# Patient Record
Sex: Male | Born: 1940 | Race: White | Hispanic: No | Marital: Single
Health system: Midwestern US, Academic
[De-identification: ages and names within clinical notes are randomized; demographics above are authoritative.]

---

## 2016-11-24 ENCOUNTER — Encounter: Admit: 2016-11-24 | Discharge: 2016-11-24 | Payer: MEDICARE

## 2016-11-24 DIAGNOSIS — I482 Chronic atrial fibrillation, unspecified: Principal | ICD-10-CM

## 2016-12-31 ENCOUNTER — Encounter: Admit: 2016-12-31 | Discharge: 2016-12-31 | Payer: MEDICARE

## 2016-12-31 DIAGNOSIS — I482 Chronic atrial fibrillation, unspecified: Principal | ICD-10-CM

## 2017-02-05 ENCOUNTER — Encounter: Admit: 2017-02-05 | Discharge: 2017-02-05 | Payer: MEDICARE

## 2017-02-05 MED ORDER — METOPROLOL TARTRATE 50 MG PO TAB
50 mg | ORAL_TABLET | Freq: Two times a day (BID) | ORAL | 2 refills | 90.00000 days | Status: AC
Start: 2017-02-05 — End: 2017-11-02

## 2017-02-23 ENCOUNTER — Encounter: Admit: 2017-02-23 | Discharge: 2017-02-23 | Payer: MEDICARE

## 2017-02-23 DIAGNOSIS — I482 Chronic atrial fibrillation, unspecified: Principal | ICD-10-CM

## 2017-04-09 ENCOUNTER — Encounter: Admit: 2017-04-09 | Discharge: 2017-04-09 | Payer: MEDICARE

## 2017-04-09 DIAGNOSIS — I482 Chronic atrial fibrillation, unspecified: Principal | ICD-10-CM

## 2017-05-18 ENCOUNTER — Encounter: Admit: 2017-05-18 | Discharge: 2017-05-18 | Payer: MEDICARE

## 2017-05-18 DIAGNOSIS — I482 Chronic atrial fibrillation, unspecified: Principal | ICD-10-CM

## 2017-07-08 ENCOUNTER — Encounter: Admit: 2017-07-08 | Discharge: 2017-07-08 | Payer: MEDICARE

## 2017-07-08 DIAGNOSIS — I482 Chronic atrial fibrillation, unspecified: Principal | ICD-10-CM

## 2017-07-08 LAB — PROTIME INR (PT): Lab: 2.4

## 2017-08-16 LAB — BASIC METABOLIC PANEL
Lab: 104
Lab: 140
Lab: 18 — ABNORMAL HIGH (ref 27.0–31.0)
Lab: 26

## 2017-08-16 LAB — CBC: Lab: 7.3

## 2017-08-24 ENCOUNTER — Encounter: Admit: 2017-08-24 | Discharge: 2017-08-24 | Payer: MEDICARE

## 2017-08-24 DIAGNOSIS — I7771 Dissection of carotid artery: Principal | ICD-10-CM

## 2017-08-26 ENCOUNTER — Encounter: Admit: 2017-08-26 | Discharge: 2017-08-26 | Payer: MEDICARE

## 2017-08-26 ENCOUNTER — Ambulatory Visit: Admit: 2017-08-26 | Discharge: 2017-08-26 | Payer: MEDICARE

## 2017-08-26 DIAGNOSIS — I7771 Dissection of carotid artery: Principal | ICD-10-CM

## 2017-08-26 MED ORDER — SODIUM CHLORIDE 0.9 % IJ SOLN
50 mL | Freq: Once | INTRAVENOUS | 0 refills | Status: CP
Start: 2017-08-26 — End: ?
  Administered 2017-08-26: 14:00:00 50 mL via INTRAVENOUS

## 2017-08-26 MED ORDER — IOHEXOL 350 MG IODINE/ML IV SOLN
60 mL | Freq: Once | INTRAVENOUS | 0 refills | Status: CP
Start: 2017-08-26 — End: ?
  Administered 2017-08-26: 14:00:00 60 mL via INTRAVENOUS

## 2017-08-27 ENCOUNTER — Encounter: Admit: 2017-08-27 | Discharge: 2017-08-27 | Payer: MEDICARE

## 2017-08-27 ENCOUNTER — Ambulatory Visit: Admit: 2017-08-27 | Discharge: 2017-08-27 | Payer: MEDICARE

## 2017-08-27 DIAGNOSIS — I7771 Dissection of carotid artery: Principal | ICD-10-CM

## 2017-08-27 DIAGNOSIS — I272 Pulmonary hypertension, unspecified: ICD-10-CM

## 2017-08-27 DIAGNOSIS — I10 Essential (primary) hypertension: ICD-10-CM

## 2017-08-27 DIAGNOSIS — I482 Chronic atrial fibrillation, unspecified: ICD-10-CM

## 2017-08-27 DIAGNOSIS — E079 Disorder of thyroid, unspecified: Principal | ICD-10-CM

## 2017-08-27 DIAGNOSIS — I4891 Unspecified atrial fibrillation: Principal | ICD-10-CM

## 2017-08-27 DIAGNOSIS — Z13220 Encounter for screening for lipoid disorders: Secondary | ICD-10-CM

## 2017-08-27 DIAGNOSIS — I350 Nonrheumatic aortic (valve) stenosis: ICD-10-CM

## 2017-08-27 LAB — LIPID PROFILE
Lab: 111 mg/dL (ref <150)
Lab: 149 mg/dL — ABNORMAL HIGH (ref <100)
Lab: 164 mg/dL
Lab: 223 mg/dL — ABNORMAL HIGH (ref <200)

## 2017-08-27 LAB — C REACTIVE PROTEIN (CRP): Lab: 0.1 mg/dL (ref <1.0)

## 2017-08-27 LAB — PROTIME INR (PT): Lab: 2.2 mg/dL — ABNORMAL HIGH (ref >40)

## 2017-08-27 LAB — SED RATE: Lab: 22 mm/h — ABNORMAL HIGH (ref 0–20)

## 2017-08-27 MED ORDER — ATORVASTATIN 40 MG PO TAB
40 mg | ORAL_TABLET | Freq: Every day | ORAL | 3 refills | Status: AC
Start: 2017-08-27 — End: 2018-09-12

## 2017-08-30 ENCOUNTER — Ambulatory Visit: Admit: 2017-08-30 | Discharge: 2017-08-31 | Payer: MEDICARE

## 2017-08-30 DIAGNOSIS — I350 Nonrheumatic aortic (valve) stenosis: Principal | ICD-10-CM

## 2017-08-30 DIAGNOSIS — I272 Pulmonary hypertension, unspecified: ICD-10-CM

## 2017-08-30 MED ORDER — PERFLUTREN LIPID MICROSPHERES 1.1 MG/ML IV SUSP
1-20 mL | Freq: Once | INTRAVENOUS | 0 refills | Status: CP | PRN
Start: 2017-08-30 — End: ?

## 2017-09-14 ENCOUNTER — Ambulatory Visit: Admit: 2017-09-14 | Discharge: 2017-09-15 | Payer: MEDICARE

## 2017-09-14 ENCOUNTER — Ambulatory Visit: Admit: 2017-09-14 | Discharge: 2017-09-14 | Payer: MEDICARE

## 2017-09-14 DIAGNOSIS — I7771 Dissection of carotid artery: Principal | ICD-10-CM

## 2017-09-14 DIAGNOSIS — E079 Disorder of thyroid, unspecified: ICD-10-CM

## 2017-09-14 MED ORDER — IOHEXOL 350 MG IODINE/ML IV SOLN
100 mL | Freq: Once | INTRAVENOUS | 0 refills | Status: CP
Start: 2017-09-14 — End: ?
  Administered 2017-09-14: 20:00:00 100 mL via INTRAVENOUS

## 2017-09-14 MED ORDER — SODIUM CHLORIDE 0.9 % IJ SOLN
50 mL | Freq: Once | INTRAVENOUS | 0 refills | Status: CP
Start: 2017-09-14 — End: ?
  Administered 2017-09-14: 20:00:00 50 mL via INTRAVENOUS

## 2017-09-17 ENCOUNTER — Encounter: Admit: 2017-09-17 | Discharge: 2017-09-17 | Payer: MEDICARE

## 2017-09-20 ENCOUNTER — Encounter: Admit: 2017-09-20 | Discharge: 2017-09-20 | Payer: MEDICARE

## 2017-09-20 DIAGNOSIS — I4891 Unspecified atrial fibrillation: Principal | ICD-10-CM

## 2017-09-20 DIAGNOSIS — I10 Essential (primary) hypertension: ICD-10-CM

## 2017-09-20 DIAGNOSIS — I350 Nonrheumatic aortic (valve) stenosis: ICD-10-CM

## 2017-09-28 ENCOUNTER — Encounter: Admit: 2017-09-28 | Discharge: 2017-09-28 | Payer: MEDICARE

## 2017-09-28 DIAGNOSIS — E079 Disorder of thyroid, unspecified: Principal | ICD-10-CM

## 2017-09-28 DIAGNOSIS — I4891 Unspecified atrial fibrillation: ICD-10-CM

## 2017-09-28 DIAGNOSIS — I482 Chronic atrial fibrillation, unspecified: Principal | ICD-10-CM

## 2017-09-28 LAB — PROTIME INR (PT): Lab: 2.1

## 2017-09-29 ENCOUNTER — Encounter: Admit: 2017-09-29 | Discharge: 2017-09-29 | Payer: MEDICARE

## 2017-09-29 DIAGNOSIS — Z7901 Long term (current) use of anticoagulants: ICD-10-CM

## 2017-09-29 DIAGNOSIS — I4891 Unspecified atrial fibrillation: Principal | ICD-10-CM

## 2017-09-30 ENCOUNTER — Ambulatory Visit: Admit: 2017-09-30 | Discharge: 2017-09-30 | Payer: MEDICARE

## 2017-09-30 ENCOUNTER — Encounter: Admit: 2017-09-30 | Discharge: 2017-09-30 | Payer: MEDICARE

## 2017-09-30 DIAGNOSIS — Z880 Allergy status to penicillin: ICD-10-CM

## 2017-09-30 DIAGNOSIS — E042 Nontoxic multinodular goiter: Principal | ICD-10-CM

## 2017-09-30 DIAGNOSIS — E079 Disorder of thyroid, unspecified: ICD-10-CM

## 2017-09-30 DIAGNOSIS — E785 Hyperlipidemia, unspecified: ICD-10-CM

## 2017-09-30 DIAGNOSIS — I4891 Unspecified atrial fibrillation: Principal | ICD-10-CM

## 2017-10-11 ENCOUNTER — Encounter: Admit: 2017-10-11 | Discharge: 2017-10-11 | Payer: MEDICARE

## 2017-10-11 ENCOUNTER — Ambulatory Visit: Admit: 2017-10-11 | Discharge: 2017-10-12 | Payer: MEDICARE

## 2017-10-11 DIAGNOSIS — E785 Hyperlipidemia, unspecified: ICD-10-CM

## 2017-10-11 DIAGNOSIS — I4891 Unspecified atrial fibrillation: Principal | ICD-10-CM

## 2017-10-11 DIAGNOSIS — I7771 Dissection of carotid artery: Principal | ICD-10-CM

## 2017-10-14 ENCOUNTER — Ambulatory Visit: Admit: 2017-10-14 | Discharge: 2017-10-15 | Payer: MEDICARE

## 2017-10-14 ENCOUNTER — Encounter: Admit: 2017-10-14 | Discharge: 2017-10-14 | Payer: MEDICARE

## 2017-10-14 DIAGNOSIS — Z7901 Long term (current) use of anticoagulants: ICD-10-CM

## 2017-10-14 DIAGNOSIS — I10 Essential (primary) hypertension: ICD-10-CM

## 2017-10-14 DIAGNOSIS — I7771 Dissection of carotid artery: ICD-10-CM

## 2017-10-14 DIAGNOSIS — E785 Hyperlipidemia, unspecified: ICD-10-CM

## 2017-10-14 DIAGNOSIS — I482 Chronic atrial fibrillation, unspecified: Principal | ICD-10-CM

## 2017-10-14 DIAGNOSIS — I4891 Unspecified atrial fibrillation: Principal | ICD-10-CM

## 2017-10-21 ENCOUNTER — Encounter: Admit: 2017-10-21 | Discharge: 2017-10-21 | Payer: MEDICARE

## 2017-10-21 DIAGNOSIS — I4891 Unspecified atrial fibrillation: Principal | ICD-10-CM

## 2017-10-21 MED ORDER — WARFARIN 5 MG PO TAB
ORAL_TABLET | Freq: Every day | ORAL | 3 refills | 90.00000 days | Status: AC
Start: 2017-10-21 — End: 2019-01-16

## 2017-10-26 ENCOUNTER — Encounter: Admit: 2017-10-26 | Discharge: 2017-10-26 | Payer: MEDICARE

## 2017-10-26 DIAGNOSIS — I482 Chronic atrial fibrillation, unspecified: Principal | ICD-10-CM

## 2017-10-26 DIAGNOSIS — Z7901 Long term (current) use of anticoagulants: ICD-10-CM

## 2017-10-26 DIAGNOSIS — I4891 Unspecified atrial fibrillation: ICD-10-CM

## 2017-10-26 LAB — PROTIME INR (PT): Lab: 2.2

## 2017-11-02 ENCOUNTER — Encounter: Admit: 2017-11-02 | Discharge: 2017-11-02 | Payer: MEDICARE

## 2017-11-02 MED ORDER — METOPROLOL TARTRATE 50 MG PO TAB
ORAL_TABLET | Freq: Two times a day (BID) | ORAL | 2 refills | 90.00000 days | Status: AC
Start: 2017-11-02 — End: 2018-07-29

## 2017-11-23 ENCOUNTER — Encounter: Admit: 2017-11-23 | Discharge: 2017-11-23 | Payer: MEDICARE

## 2017-11-23 DIAGNOSIS — Z7901 Long term (current) use of anticoagulants: ICD-10-CM

## 2017-11-23 DIAGNOSIS — I482 Chronic atrial fibrillation, unspecified: Principal | ICD-10-CM

## 2017-11-23 DIAGNOSIS — I4891 Unspecified atrial fibrillation: ICD-10-CM

## 2017-11-23 LAB — PROTIME INR (PT): Lab: 2.3

## 2017-12-21 ENCOUNTER — Encounter: Admit: 2017-12-21 | Discharge: 2017-12-21 | Payer: MEDICARE

## 2017-12-21 DIAGNOSIS — I482 Chronic atrial fibrillation, unspecified: Principal | ICD-10-CM

## 2017-12-21 DIAGNOSIS — I4891 Unspecified atrial fibrillation: Principal | ICD-10-CM

## 2017-12-21 DIAGNOSIS — Z7901 Long term (current) use of anticoagulants: ICD-10-CM

## 2017-12-21 LAB — PROTIME INR (PT): Lab: 2.5 U/L (ref 7–56)

## 2018-01-03 ENCOUNTER — Encounter: Admit: 2018-01-03 | Discharge: 2018-01-03 | Payer: MEDICARE

## 2018-01-03 ENCOUNTER — Ambulatory Visit: Admit: 2018-01-03 | Discharge: 2018-01-04 | Payer: MEDICARE

## 2018-01-03 DIAGNOSIS — I10 Essential (primary) hypertension: ICD-10-CM

## 2018-01-03 DIAGNOSIS — I4891 Unspecified atrial fibrillation: Principal | ICD-10-CM

## 2018-01-03 DIAGNOSIS — I7771 Dissection of carotid artery: ICD-10-CM

## 2018-01-03 DIAGNOSIS — I350 Nonrheumatic aortic (valve) stenosis: ICD-10-CM

## 2018-01-03 DIAGNOSIS — E785 Hyperlipidemia, unspecified: ICD-10-CM

## 2018-01-25 ENCOUNTER — Encounter: Admit: 2018-01-25 | Discharge: 2018-01-25 | Payer: MEDICARE

## 2018-01-25 DIAGNOSIS — I4891 Unspecified atrial fibrillation: Principal | ICD-10-CM

## 2018-01-25 DIAGNOSIS — Z7901 Long term (current) use of anticoagulants: ICD-10-CM

## 2018-01-25 DIAGNOSIS — I482 Chronic atrial fibrillation, unspecified: Principal | ICD-10-CM

## 2018-01-25 LAB — PROTIME INR (PT): Lab: 2.3

## 2018-02-22 ENCOUNTER — Encounter: Admit: 2018-02-22 | Discharge: 2018-02-22 | Payer: MEDICARE

## 2018-02-22 DIAGNOSIS — I4891 Unspecified atrial fibrillation: Principal | ICD-10-CM

## 2018-02-22 DIAGNOSIS — Z7901 Long term (current) use of anticoagulants: ICD-10-CM

## 2018-02-22 LAB — PROTIME INR (PT): Lab: 2.5

## 2018-02-28 ENCOUNTER — Ambulatory Visit: Admit: 2018-02-28 | Discharge: 2018-02-28 | Payer: MEDICARE

## 2018-02-28 ENCOUNTER — Encounter: Admit: 2018-02-28 | Discharge: 2018-02-28 | Payer: MEDICARE

## 2018-02-28 DIAGNOSIS — I7771 Dissection of carotid artery: Principal | ICD-10-CM

## 2018-02-28 DIAGNOSIS — I4891 Unspecified atrial fibrillation: Principal | ICD-10-CM

## 2018-02-28 DIAGNOSIS — E785 Hyperlipidemia, unspecified: ICD-10-CM

## 2018-02-28 LAB — POC CREATININE, RAD: Lab: 0.9 mg/dL (ref 0.4–1.24)

## 2018-02-28 MED ORDER — IOHEXOL 350 MG IODINE/ML IV SOLN
70 mL | Freq: Once | INTRAVENOUS | 0 refills | Status: CP
Start: 2018-02-28 — End: ?
  Administered 2018-02-28: 15:00:00 70 mL via INTRAVENOUS

## 2018-02-28 MED ORDER — SODIUM CHLORIDE 0.9 % IJ SOLN
50 mL | Freq: Once | INTRAVENOUS | 0 refills | Status: CP
Start: 2018-02-28 — End: ?
  Administered 2018-02-28: 15:00:00 50 mL via INTRAVENOUS

## 2018-03-22 ENCOUNTER — Encounter: Admit: 2018-03-22 | Discharge: 2018-03-22 | Payer: MEDICARE

## 2018-03-22 DIAGNOSIS — I4891 Unspecified atrial fibrillation: Principal | ICD-10-CM

## 2018-03-22 DIAGNOSIS — Z7901 Long term (current) use of anticoagulants: ICD-10-CM

## 2018-03-22 LAB — PROTIME INR (PT): Lab: 2.6 pg (ref 26–34)

## 2018-04-19 ENCOUNTER — Encounter: Admit: 2018-04-19 | Discharge: 2018-04-19 | Payer: MEDICARE

## 2018-04-19 DIAGNOSIS — Z7901 Long term (current) use of anticoagulants: ICD-10-CM

## 2018-04-19 DIAGNOSIS — I4891 Unspecified atrial fibrillation: Principal | ICD-10-CM

## 2018-04-19 LAB — PROTIME INR (PT): Lab: 3

## 2018-05-18 ENCOUNTER — Encounter: Admit: 2018-05-18 | Discharge: 2018-05-18 | Payer: MEDICARE

## 2018-05-18 DIAGNOSIS — Z7901 Long term (current) use of anticoagulants: ICD-10-CM

## 2018-05-18 DIAGNOSIS — I4891 Unspecified atrial fibrillation: Principal | ICD-10-CM

## 2018-05-18 LAB — PROTIME INR (PT): Lab: 2.2 — AB

## 2018-05-25 ENCOUNTER — Encounter: Admit: 2018-05-25 | Discharge: 2018-05-25 | Payer: MEDICARE

## 2018-05-26 ENCOUNTER — Encounter: Admit: 2018-05-26 | Discharge: 2018-05-26 | Payer: MEDICARE

## 2018-05-26 DIAGNOSIS — I4891 Unspecified atrial fibrillation: Principal | ICD-10-CM

## 2018-05-26 DIAGNOSIS — I10 Essential (primary) hypertension: ICD-10-CM

## 2018-05-26 DIAGNOSIS — I350 Nonrheumatic aortic (valve) stenosis: ICD-10-CM

## 2018-05-26 LAB — LIPID PROFILE
Lab: 106 — ABNORMAL HIGH (ref <100)
Lab: 167
Lab: 3
Lab: 106
Lab: 21
Lab: 53

## 2018-05-31 ENCOUNTER — Encounter: Admit: 2018-05-31 | Discharge: 2018-05-31 | Payer: MEDICARE

## 2018-05-31 DIAGNOSIS — Z7901 Long term (current) use of anticoagulants: ICD-10-CM

## 2018-05-31 DIAGNOSIS — I4891 Unspecified atrial fibrillation: Principal | ICD-10-CM

## 2018-05-31 LAB — PROTIME INR (PT): Lab: 2

## 2018-06-02 ENCOUNTER — Ambulatory Visit: Admit: 2018-06-02 | Discharge: 2018-06-02 | Payer: MEDICARE

## 2018-06-02 ENCOUNTER — Encounter: Admit: 2018-06-02 | Discharge: 2018-06-02 | Payer: MEDICARE

## 2018-06-02 DIAGNOSIS — I7771 Dissection of carotid artery: ICD-10-CM

## 2018-06-02 DIAGNOSIS — E785 Hyperlipidemia, unspecified: ICD-10-CM

## 2018-06-02 DIAGNOSIS — I10 Essential (primary) hypertension: ICD-10-CM

## 2018-06-02 DIAGNOSIS — I358 Other nonrheumatic aortic valve disorders: ICD-10-CM

## 2018-06-02 DIAGNOSIS — I4891 Unspecified atrial fibrillation: Principal | ICD-10-CM

## 2018-06-02 DIAGNOSIS — I4811 Longstanding persistent atrial fibrillation: Principal | ICD-10-CM

## 2018-06-02 DIAGNOSIS — Z7901 Long term (current) use of anticoagulants: ICD-10-CM

## 2018-06-29 ENCOUNTER — Encounter: Admit: 2018-06-29 | Discharge: 2018-06-29 | Payer: MEDICARE

## 2018-06-29 DIAGNOSIS — I4891 Unspecified atrial fibrillation: Secondary | ICD-10-CM

## 2018-06-29 DIAGNOSIS — Z7901 Long term (current) use of anticoagulants: Secondary | ICD-10-CM

## 2018-06-29 LAB — PROTIME INR (PT): Lab: 2.8 g/dL (ref 1.9–3.7)

## 2018-07-26 ENCOUNTER — Encounter: Admit: 2018-07-26 | Discharge: 2018-07-26 | Payer: MEDICARE

## 2018-07-26 DIAGNOSIS — Z7901 Long term (current) use of anticoagulants: ICD-10-CM

## 2018-07-26 DIAGNOSIS — I4891 Unspecified atrial fibrillation: Principal | ICD-10-CM

## 2018-07-26 LAB — PROTIME INR (PT): Lab: 2.9

## 2018-07-29 ENCOUNTER — Encounter: Admit: 2018-07-29 | Discharge: 2018-07-29 | Payer: MEDICARE

## 2018-07-29 MED ORDER — METOPROLOL TARTRATE 50 MG PO TAB
50 mg | ORAL_TABLET | Freq: Two times a day (BID) | ORAL | 3 refills | 90.00000 days | Status: AC
Start: 2018-07-29 — End: 2018-07-29

## 2018-07-29 MED ORDER — METOPROLOL TARTRATE 50 MG PO TAB
50 mg | ORAL_TABLET | Freq: Two times a day (BID) | ORAL | 3 refills | 90.00000 days | Status: AC
Start: 2018-07-29 — End: 2019-07-27

## 2018-08-24 ENCOUNTER — Encounter: Admit: 2018-08-24 | Discharge: 2018-08-24 | Payer: MEDICARE

## 2018-08-24 DIAGNOSIS — I4811 Longstanding persistent atrial fibrillation: Principal | ICD-10-CM

## 2018-08-24 NOTE — Telephone Encounter
Pt LVM stating he had his INR drawn yesterday 08/23/18 and wanting to know the results. Spoke with pt, pt stated he had labs drawn in [place]. In basket sent to [facility] team to obtain the results or call pt with the results once received.

## 2018-09-12 ENCOUNTER — Encounter: Admit: 2018-09-12 | Discharge: 2018-09-12 | Payer: MEDICARE

## 2018-09-12 MED ORDER — ATORVASTATIN 40 MG PO TAB
ORAL_TABLET | Freq: Every day | 2 refills | Status: AC
Start: 2018-09-12 — End: 2019-05-30

## 2018-10-05 ENCOUNTER — Encounter: Admit: 2018-10-05 | Discharge: 2018-10-05 | Payer: MEDICARE

## 2018-10-05 DIAGNOSIS — I4891 Unspecified atrial fibrillation: ICD-10-CM

## 2018-10-05 DIAGNOSIS — Z7901 Long term (current) use of anticoagulants: ICD-10-CM

## 2018-10-05 DIAGNOSIS — I4811 Longstanding persistent atrial fibrillation: Principal | ICD-10-CM

## 2018-11-01 ENCOUNTER — Encounter: Admit: 2018-11-01 | Discharge: 2018-11-01 | Payer: MEDICARE

## 2018-11-01 DIAGNOSIS — Z7901 Long term (current) use of anticoagulants: ICD-10-CM

## 2018-11-01 DIAGNOSIS — I4891 Unspecified atrial fibrillation: ICD-10-CM

## 2018-11-01 DIAGNOSIS — I4811 Longstanding persistent atrial fibrillation: Principal | ICD-10-CM

## 2018-11-01 LAB — PROTIME INR (PT): Lab: 3.1 mg/dL (ref 70–100)

## 2018-11-22 ENCOUNTER — Encounter: Admit: 2018-11-22 | Discharge: 2018-11-22

## 2018-11-22 DIAGNOSIS — I4811 Longstanding persistent atrial fibrillation: Secondary | ICD-10-CM

## 2018-12-27 ENCOUNTER — Encounter: Admit: 2018-12-27 | Discharge: 2018-12-27

## 2018-12-27 DIAGNOSIS — I4811 Longstanding persistent atrial fibrillation: Secondary | ICD-10-CM

## 2019-01-14 ENCOUNTER — Encounter: Admit: 2019-01-14 | Discharge: 2019-01-14

## 2019-01-14 DIAGNOSIS — I4891 Unspecified atrial fibrillation: Secondary | ICD-10-CM

## 2019-01-16 MED ORDER — WARFARIN 5 MG PO TAB
ORAL_TABLET | Freq: Every day | ORAL | 3 refills | 90.00000 days | Status: DC
Start: 2019-01-16 — End: 2019-01-17

## 2019-01-17 ENCOUNTER — Encounter: Admit: 2019-01-17 | Discharge: 2019-01-17

## 2019-01-17 MED ORDER — WARFARIN 5 MG PO TAB
ORAL_TABLET | ORAL | 3 refills | 90.00000 days | Status: DC
Start: 2019-01-17 — End: 2020-01-08

## 2019-01-17 NOTE — Telephone Encounter
01/17/2019 4:03 PM     rec'd call from pharmacy re: warfarin, needing a refill and since DJW is retired, needs a new MD to co-sign med order.     Medication filled under DJW name yesetrday 8/3 cancelled and new med ordered under [facility] name until pt establishes care with another [place] MD.

## 2019-01-24 ENCOUNTER — Encounter: Admit: 2019-01-24 | Discharge: 2019-01-24

## 2019-01-24 DIAGNOSIS — I4811 Longstanding persistent atrial fibrillation: Secondary | ICD-10-CM

## 2019-01-24 LAB — PROTIME INR (PT): Lab: 3.5 mg/dL (ref 7–25)

## 2019-02-07 ENCOUNTER — Encounter: Admit: 2019-02-07 | Discharge: 2019-02-07

## 2019-02-07 DIAGNOSIS — I4811 Longstanding persistent atrial fibrillation: Secondary | ICD-10-CM

## 2019-02-07 LAB — PROTIME INR (PT): Lab: 2.1

## 2019-02-28 ENCOUNTER — Encounter: Admit: 2019-02-28 | Discharge: 2019-02-28 | Payer: MEDICARE

## 2019-02-28 LAB — PROTIME INR (PT): Lab: 2.5

## 2019-03-28 ENCOUNTER — Encounter: Admit: 2019-03-28 | Discharge: 2019-03-28 | Payer: MEDICARE

## 2019-03-28 DIAGNOSIS — I4811 Longstanding persistent atrial fibrillation: Secondary | ICD-10-CM

## 2019-03-28 LAB — PROTIME INR (PT)
Lab: 2.6
Lab: 27 — ABNORMAL HIGH (ref 9.9–12.6)

## 2019-05-02 ENCOUNTER — Encounter: Admit: 2019-05-02 | Discharge: 2019-05-02 | Payer: MEDICARE

## 2019-05-02 DIAGNOSIS — I4811 Longstanding persistent atrial fibrillation: Secondary | ICD-10-CM

## 2019-05-02 LAB — PROTIME INR (PT): Lab: 2.8

## 2019-05-30 ENCOUNTER — Encounter: Admit: 2019-05-30 | Discharge: 2019-05-30 | Payer: MEDICARE

## 2019-05-30 MED ORDER — ATORVASTATIN 40 MG PO TAB
40 mg | ORAL_TABLET | Freq: Every day | ORAL | 0 refills | Status: DC
Start: 2019-05-30 — End: 2019-07-11

## 2019-06-06 ENCOUNTER — Encounter: Admit: 2019-06-06 | Discharge: 2019-06-06 | Payer: MEDICARE

## 2019-06-06 DIAGNOSIS — I4891 Unspecified atrial fibrillation: Secondary | ICD-10-CM

## 2019-06-27 ENCOUNTER — Encounter: Admit: 2019-06-27 | Discharge: 2019-06-27 | Payer: MEDICARE

## 2019-06-27 DIAGNOSIS — I4811 Longstanding persistent atrial fibrillation: Secondary | ICD-10-CM

## 2019-06-27 LAB — PROTIME INR (PT): Lab: 2.6

## 2019-07-11 ENCOUNTER — Encounter: Admit: 2019-07-11 | Discharge: 2019-07-11 | Payer: MEDICARE

## 2019-07-11 DIAGNOSIS — E785 Hyperlipidemia, unspecified: Secondary | ICD-10-CM

## 2019-07-11 DIAGNOSIS — I10 Essential (primary) hypertension: Secondary | ICD-10-CM

## 2019-07-11 DIAGNOSIS — I4891 Unspecified atrial fibrillation: Secondary | ICD-10-CM

## 2019-07-11 LAB — LIPID PROFILE
Lab: 106 — ABNORMAL HIGH (ref <100)
Lab: 108
Lab: 185
Lab: 22
Lab: 3
Lab: 57

## 2019-07-11 MED ORDER — ATORVASTATIN 40 MG PO TAB
80 mg | ORAL_TABLET | Freq: Every day | ORAL | 0 refills | Status: DC
Start: 2019-07-11 — End: 2019-08-17

## 2019-07-11 NOTE — Telephone Encounter
07/11/2019 3:04 PM   Notified patient of lab results and recommendations to increase lipitor 40 mg daily to 80 mg daily and recheck LFP in three months per Dr. Sandria Manly.[place] Reek, RN    Love, Mauri Pole) T, MD  Lauralee Evener, RN  Can we increase his atorvastatin to 80 mg nightly? Recheck fasting lipid panel in 3 months.   Thanks!

## 2019-07-27 ENCOUNTER — Encounter: Admit: 2019-07-27 | Discharge: 2019-07-27 | Payer: MEDICARE

## 2019-07-27 MED ORDER — METOPROLOL TARTRATE 50 MG PO TAB
ORAL_TABLET | Freq: Two times a day (BID) | ORAL | 3 refills | 90.00000 days | Status: DC
Start: 2019-07-27 — End: 2020-01-16

## 2019-07-31 ENCOUNTER — Encounter: Admit: 2019-07-31 | Discharge: 2019-07-31 | Payer: MEDICARE

## 2019-07-31 DIAGNOSIS — I4811 Longstanding persistent atrial fibrillation: Secondary | ICD-10-CM

## 2019-07-31 DIAGNOSIS — I4891 Unspecified atrial fibrillation: Secondary | ICD-10-CM

## 2019-07-31 LAB — PROTIME INR (PT): Lab: 3.9

## 2019-08-15 ENCOUNTER — Encounter: Admit: 2019-08-15 | Discharge: 2019-08-15 | Payer: MEDICARE

## 2019-08-15 DIAGNOSIS — I4811 Longstanding persistent atrial fibrillation: Secondary | ICD-10-CM

## 2019-08-15 LAB — PROTIME INR (PT): Lab: 2.6

## 2019-08-17 ENCOUNTER — Encounter: Admit: 2019-08-17 | Discharge: 2019-08-17 | Payer: MEDICARE

## 2019-08-17 MED ORDER — ATORVASTATIN 40 MG PO TAB
80 mg | ORAL_TABLET | Freq: Every day | ORAL | 3 refills | Status: DC
Start: 2019-08-17 — End: 2019-10-17

## 2019-08-17 NOTE — Telephone Encounter
-----   Message from Ilda Mori sent at 08/17/2019  2:41 PM CST -----  Regarding: atorvastatin  Gabriel Davidson needs a refill on his atorvastatin sent into [facility] in [place]. He's doing 2 40mg  tab daily

## 2019-08-21 ENCOUNTER — Encounter: Admit: 2019-08-21 | Discharge: 2019-08-21 | Payer: MEDICARE

## 2019-08-31 ENCOUNTER — Encounter: Admit: 2019-08-31 | Discharge: 2019-08-31 | Payer: MEDICARE

## 2019-08-31 DIAGNOSIS — I4811 Longstanding persistent atrial fibrillation: Secondary | ICD-10-CM

## 2019-08-31 LAB — PROTIME INR (PT): Lab: 2.4

## 2019-10-03 NOTE — Telephone Encounter
-----   Message from Chrissie Noa Feliz Beam) Alger Simons, MD sent at 10/03/2019  1:35 PM CDT -----  Looks good.  No changes.Thanks Albion Weatherholtz!  ----- Message -----  From: Rosezena Sensor, BSN  Sent: 10/03/2019   1:24 PM CDT  To: Mauri Pole) Alger Simons, MD    Pt on atorvastatin 80mg  daily

## 2019-10-17 ENCOUNTER — Encounter: Admit: 2019-10-17 | Discharge: 2019-10-17 | Payer: MEDICARE

## 2019-10-17 MED ORDER — ATORVASTATIN 40 MG PO TAB
80 mg | ORAL_TABLET | Freq: Every day | ORAL | 2 refills | Status: DC
Start: 2019-10-17 — End: 2019-10-18

## 2019-10-18 ENCOUNTER — Encounter: Admit: 2019-10-18 | Discharge: 2019-10-18 | Payer: MEDICARE

## 2019-10-18 MED ORDER — ATORVASTATIN 40 MG PO TAB
80 mg | ORAL_TABLET | Freq: Every day | ORAL | 2 refills | Status: DC
Start: 2019-10-18 — End: 2019-12-13

## 2019-10-24 ENCOUNTER — Encounter: Admit: 2019-10-24 | Discharge: 2019-10-24 | Payer: MEDICARE

## 2019-10-24 DIAGNOSIS — I4811 Longstanding persistent atrial fibrillation: Secondary | ICD-10-CM

## 2019-10-24 LAB — PROTIME INR (PT): Lab: 3.4

## 2019-10-31 ENCOUNTER — Encounter: Admit: 2019-10-31 | Discharge: 2019-10-31 | Payer: MEDICARE

## 2019-10-31 DIAGNOSIS — I4891 Unspecified atrial fibrillation: Secondary | ICD-10-CM

## 2019-10-31 DIAGNOSIS — I4811 Longstanding persistent atrial fibrillation: Secondary | ICD-10-CM

## 2019-10-31 LAB — PROTIME INR (PT): Lab: 1.9 MMOL/L — ABNORMAL HIGH (ref 0.89–1.11)

## 2019-11-14 ENCOUNTER — Encounter: Admit: 2019-11-14 | Discharge: 2019-11-14 | Payer: MEDICARE

## 2019-11-14 DIAGNOSIS — I4811 Longstanding persistent atrial fibrillation: Secondary | ICD-10-CM

## 2019-11-14 LAB — PROTIME INR (PT): Lab: 3

## 2019-11-21 ENCOUNTER — Encounter: Admit: 2019-11-21 | Discharge: 2019-11-21 | Payer: MEDICARE

## 2019-11-21 DIAGNOSIS — I4811 Longstanding persistent atrial fibrillation: Secondary | ICD-10-CM

## 2019-11-21 LAB — PROTIME INR (PT): Lab: 1.9

## 2019-11-22 ENCOUNTER — Encounter: Admit: 2019-11-22 | Discharge: 2019-11-22 | Payer: MEDICARE

## 2019-11-22 NOTE — Progress Notes
Patient requested home INR machine.  Enrollment completed and faxed on 6/1.

## 2019-11-28 ENCOUNTER — Encounter: Admit: 2019-11-28 | Discharge: 2019-11-28 | Payer: MEDICARE

## 2019-11-28 DIAGNOSIS — I4811 Longstanding persistent atrial fibrillation: Secondary | ICD-10-CM

## 2019-11-28 LAB — PROTIME INR (PT): Lab: 2.5

## 2019-12-05 ENCOUNTER — Encounter: Admit: 2019-12-05 | Discharge: 2019-12-05 | Payer: MEDICARE

## 2019-12-05 DIAGNOSIS — I4811 Longstanding persistent atrial fibrillation: Secondary | ICD-10-CM

## 2019-12-05 LAB — PROTIME INR (PT): Lab: 2.8

## 2019-12-12 ENCOUNTER — Encounter: Admit: 2019-12-12 | Discharge: 2019-12-12 | Payer: MEDICARE

## 2019-12-12 DIAGNOSIS — I4811 Longstanding persistent atrial fibrillation: Secondary | ICD-10-CM

## 2019-12-12 LAB — PROTIME INR (PT): Lab: 1.7 — ABNORMAL LOW (ref 2–3)

## 2019-12-13 ENCOUNTER — Encounter: Admit: 2019-12-13 | Discharge: 2019-12-13 | Payer: MEDICARE

## 2019-12-13 MED ORDER — ATORVASTATIN 40 MG PO TAB
80 mg | ORAL_TABLET | Freq: Every day | ORAL | 1 refills | Status: DC
Start: 2019-12-13 — End: 2019-12-13

## 2019-12-19 ENCOUNTER — Encounter: Admit: 2019-12-19 | Discharge: 2019-12-19 | Payer: MEDICARE

## 2019-12-19 DIAGNOSIS — I4811 Longstanding persistent atrial fibrillation: Secondary | ICD-10-CM

## 2019-12-19 DIAGNOSIS — I4891 Unspecified atrial fibrillation: Secondary | ICD-10-CM

## 2019-12-19 LAB — PROTIME INR (PT): Lab: 2.8

## 2019-12-19 MED ORDER — ATORVASTATIN 80 MG PO TAB
80 mg | ORAL_TABLET | Freq: Every day | ORAL | 1 refills | Status: AC
Start: 2019-12-19 — End: ?

## 2019-12-19 NOTE — Telephone Encounter
Received fax for atorvastatin refill to pharmacy for 90 day. Ok'd refill via escribe.

## 2019-12-24 IMAGING — MR Soft Tissue Neck^ROUTINE
9 of 11 series · 41 of 48 positions shown · non-contrast
Comparison: none

[Series 2: T1 · coronal · 5.0mm · 0.51mm/px · 4 of 20 slices shown (1 of 3)]
[im 1/20]
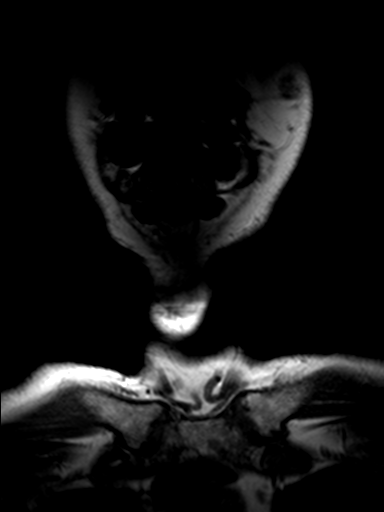
[im 7/20]
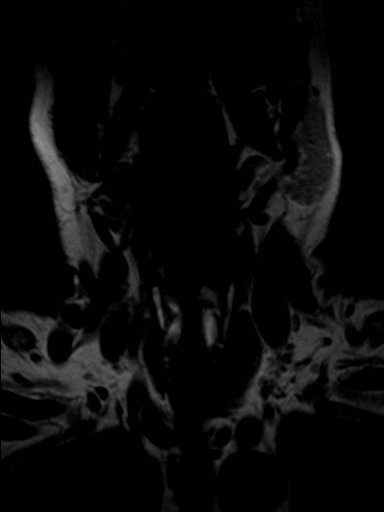
[im 13/20]
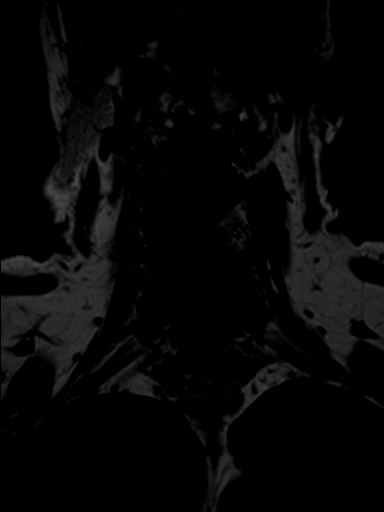
[im 20/20]
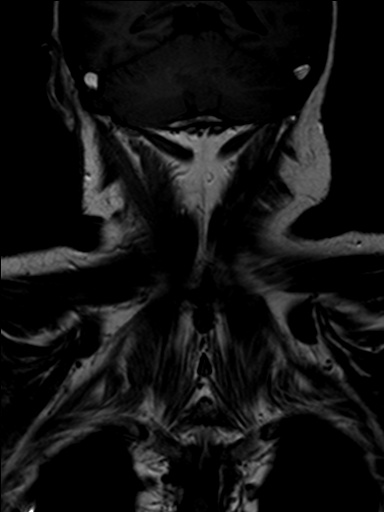

[Series 3: STIR · coronal · 5.0mm · 0.51mm/px · 3 of 20 slices shown]
[im 1/20]
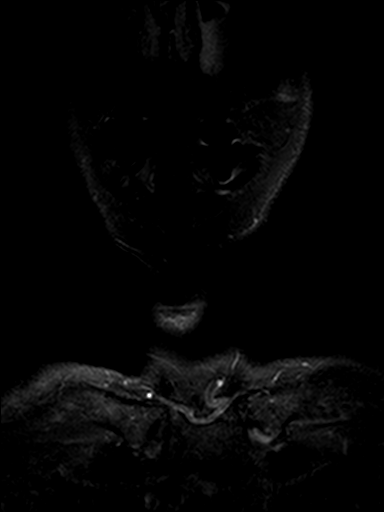
[im 7/20]
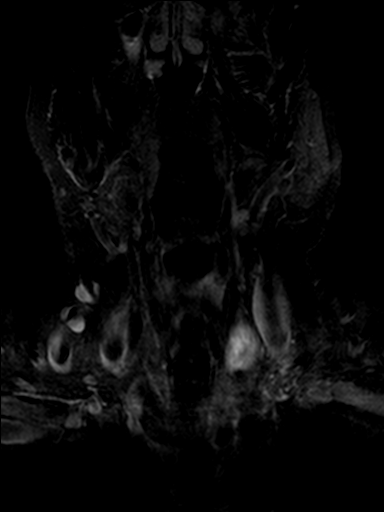
[im 13/20]
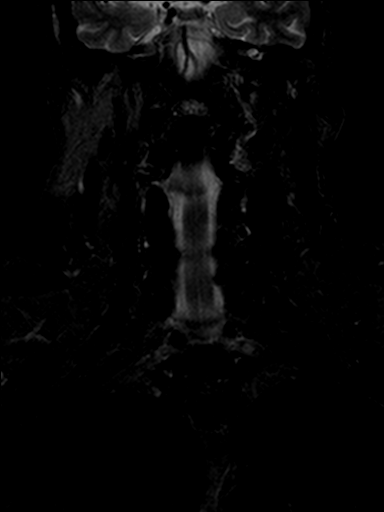

[Series 4: T2 fat-sat · axial · 6.0mm · 0.94mm/px · z∈[-81,+116]mm · 4 of 23 slices shown]
[im 1/23]
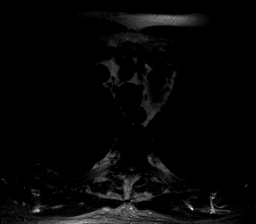
[im 8/23]
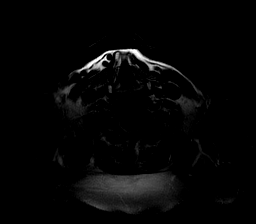
[im 15/23]
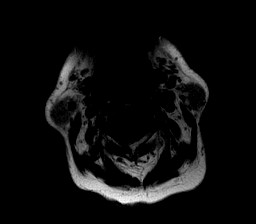
[im 23/23]
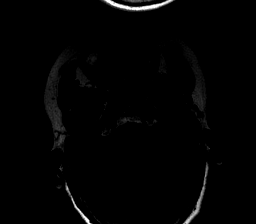

[Series 5: T1 · axial · 6.0mm · 0.94mm/px · z∈[-79,+110]mm · 6 of 25 slices shown (2 of 3)]
[im 1/25]
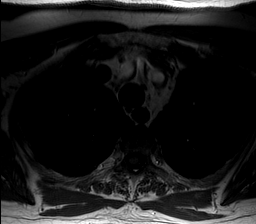
[im 5/25]
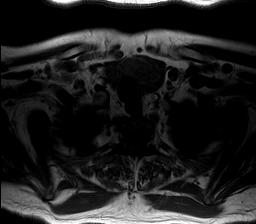
[im 10/25]
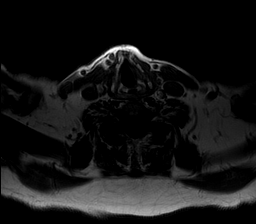
[im 15/25]
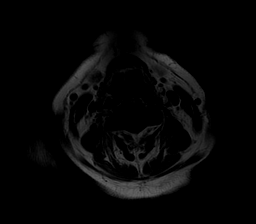
[im 20/25]
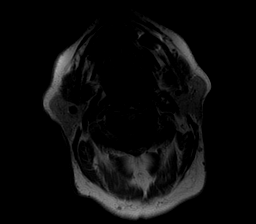
[im 25/25]
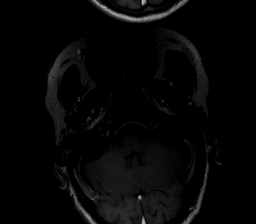

[Series 6: T1 · sagittal · 5.0mm · 0.51mm/px · 5 of 20 slices shown (3 of 3)]
[im 1/20]
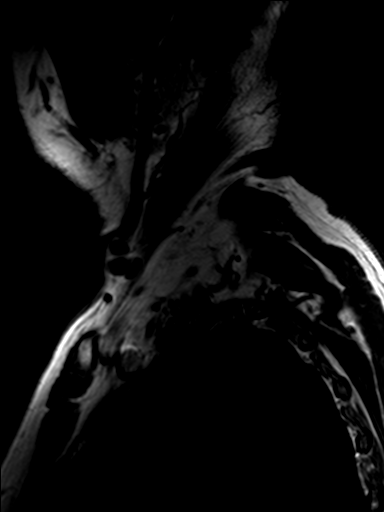
[im 5/20]
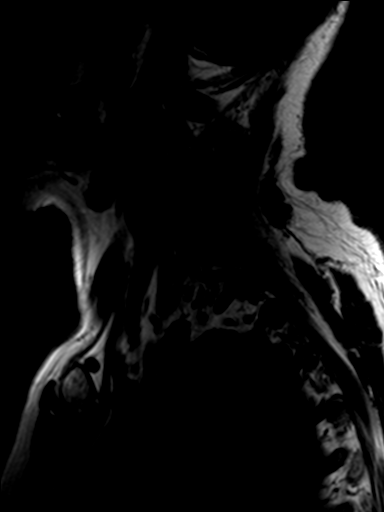
[im 10/20]
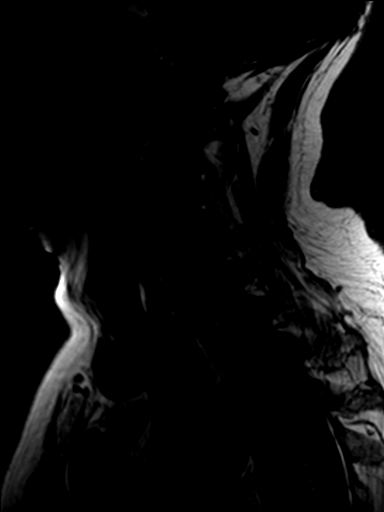
[im 15/20]
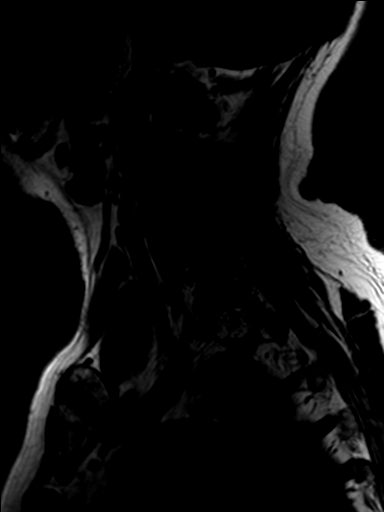
[im 20/20]
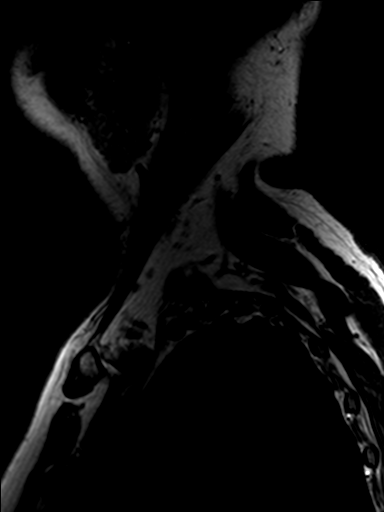

[Series 8: T1 fat-sat post-contrast · axial · 6.0mm · 0.94mm/px · z∈[-12,+138]mm · 5 of 20 slices shown (1 of 4)]
[im 1/20]
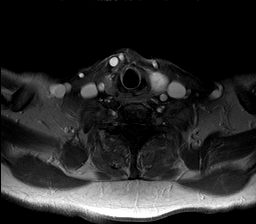
[im 5/20]
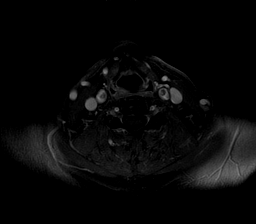
[im 10/20]
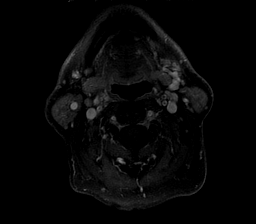
[im 15/20]
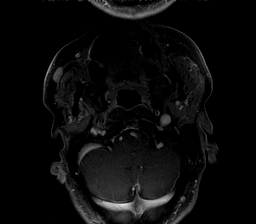
[im 20/20]
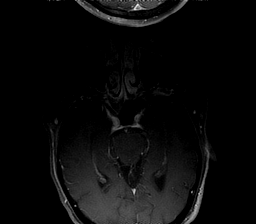

[Series 9: T1 fat-sat post-contrast · coronal · 5.0mm · 1.02mm/px · 4 of 16 slices shown (2 of 4)]
[im 1/16]
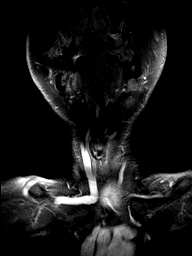
[im 6/16]
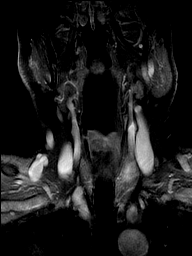
[im 11/16]
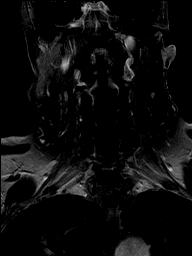
[im 16/16]
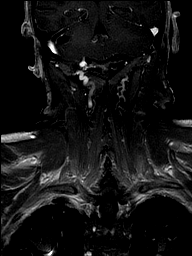

[Series 10: T1 fat-sat post-contrast · axial · 6.0mm · 0.94mm/px · z∈[-83,+67]mm · 5 of 20 slices shown (3 of 4)]
[im 1/20]
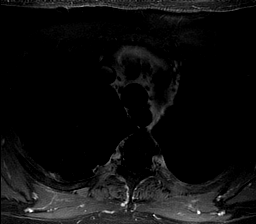
[im 5/20]
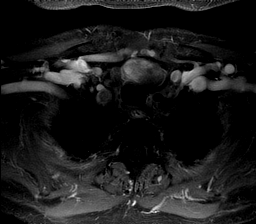
[im 10/20]
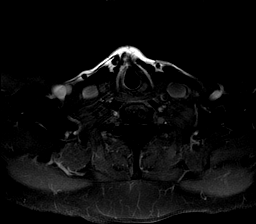
[im 15/20]
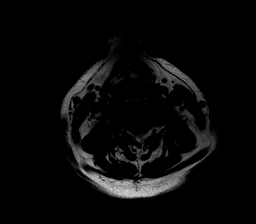
[im 20/20]
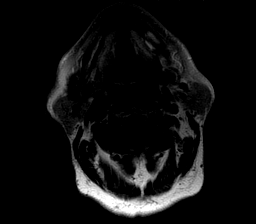

[Series 11: T1 fat-sat post-contrast · sagittal · 5.0mm · 0.51mm/px · 5 of 20 slices shown (4 of 4)]
[im 1/20]
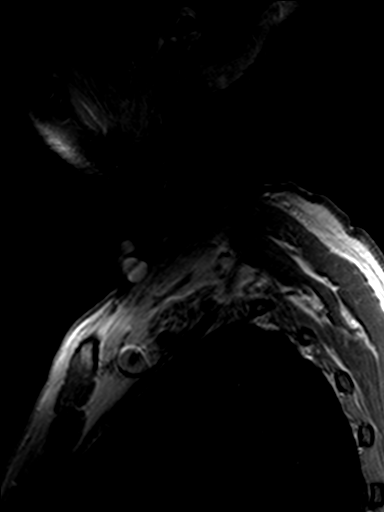
[im 5/20]
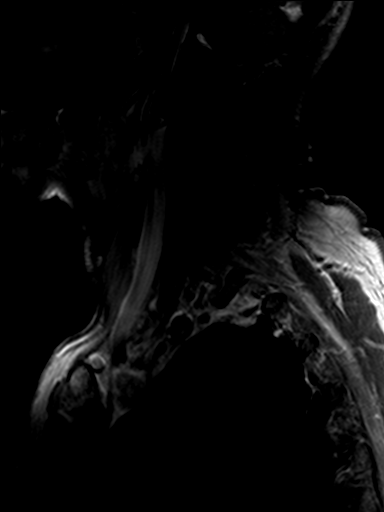
[im 10/20]
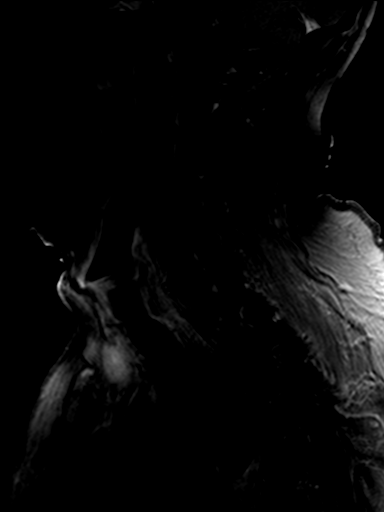
[im 15/20]
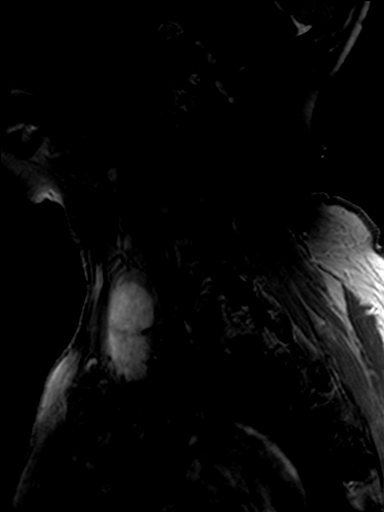
[im 20/20]
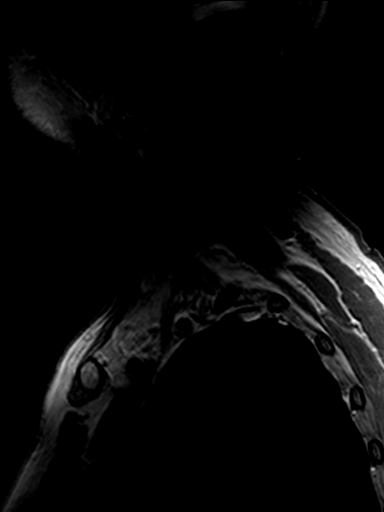

[41 of 48 positions shown; findings below may reference images not displayed]

MRI REPORT

DIAGNOSTIC STUDIES

EXAM

MRI soft tissue neck with contrast

INDICATION

horners
Right eye droop, sinus pressure, right cheek pain.
Patient received 15cc gadovist. BG

TECHNIQUE

Multiplanar multi sequence MR images of the cervical soft tissues were obtained before and after
the administration of intravenous gadolinium.

COMPARISONS

MRI brain August 17, 2017

FINDINGS

The cervical vertebral body heights are maintained with degenerative changes of the intervertebral
discs. There is demonstration of a dominant nodule within the left thyroid gland extending into the
thyroid isthmus measuring 4.0 by 2.2 centimeters. There is demonstration of intramural thrombosis
and luminal narrowing of the distal right internal carotid artery at the level of the skullbase.
The inferior extent of dissection is not able to be noted due to motion artifact. The left carotid
arteries and bilateral vertebral arteries are grossly within normal limits. There is no evidence of
pathologically enlarged cervical lymph node. There is no evidence of additional mass or fluid
collection. The lung apices are clear.

IMPRESSION

1. There is demonstration of focal luminal narrowing of the distal right internal carotid artery
at the skullbase approximately 5 centimeters above the level of the bifurcation without complete
occlusion consistent with skullbase carotid artery dissection likely representing the nidus of the
patient's symptoms.

2. No other acute abnormality is appreciated.

3. Incidental note is made of a large dominant nodule within the thyroid isthmus and medial left
thyroid for which further evaluation with dedicated ultrasound may be useful for improved
characterization.

## 2019-12-26 ENCOUNTER — Encounter: Admit: 2019-12-26 | Discharge: 2019-12-26 | Payer: MEDICARE

## 2019-12-26 DIAGNOSIS — I4811 Longstanding persistent atrial fibrillation: Secondary | ICD-10-CM

## 2019-12-26 LAB — PROTIME INR (PT): Lab: 2.1 M/UL (ref 4.4–5.5)

## 2020-01-02 ENCOUNTER — Encounter: Admit: 2020-01-02 | Discharge: 2020-01-02 | Payer: MEDICARE

## 2020-01-02 DIAGNOSIS — I4811 Longstanding persistent atrial fibrillation: Secondary | ICD-10-CM

## 2020-01-02 LAB — PROTIME INR (PT): Lab: 2.4

## 2020-01-08 ENCOUNTER — Encounter: Admit: 2020-01-08 | Discharge: 2020-01-08 | Payer: MEDICARE

## 2020-01-08 MED ORDER — WARFARIN 5 MG PO TAB
ORAL_TABLET | ORAL | 1 refills | 90.00000 days | Status: AC
Start: 2020-01-08 — End: ?

## 2020-01-09 ENCOUNTER — Encounter: Admit: 2020-01-09 | Discharge: 2020-01-09 | Payer: MEDICARE

## 2020-01-09 DIAGNOSIS — I4891 Unspecified atrial fibrillation: Secondary | ICD-10-CM

## 2020-01-16 ENCOUNTER — Encounter: Admit: 2020-01-16 | Discharge: 2020-01-16 | Payer: MEDICARE

## 2020-01-16 DIAGNOSIS — E785 Hyperlipidemia, unspecified: Secondary | ICD-10-CM

## 2020-01-16 DIAGNOSIS — I350 Nonrheumatic aortic (valve) stenosis: Secondary | ICD-10-CM

## 2020-01-16 DIAGNOSIS — I358 Other nonrheumatic aortic valve disorders: Secondary | ICD-10-CM

## 2020-01-16 DIAGNOSIS — I7771 Dissection of carotid artery: Secondary | ICD-10-CM

## 2020-01-16 DIAGNOSIS — I10 Essential (primary) hypertension: Secondary | ICD-10-CM

## 2020-01-16 DIAGNOSIS — I272 Pulmonary hypertension, unspecified: Secondary | ICD-10-CM

## 2020-01-16 DIAGNOSIS — I4891 Unspecified atrial fibrillation: Secondary | ICD-10-CM

## 2020-01-16 MED ORDER — METOPROLOL TARTRATE 75 MG PO TAB
75 mg | ORAL_TABLET | Freq: Two times a day (BID) | ORAL | 3 refills | 90.00000 days | Status: AC
Start: 2020-01-16 — End: ?

## 2020-01-16 NOTE — Patient Instructions
Increase metoprolol to 75mg  twice daily.    Echo and Carotid ultrasound in 6 months.    Follow up 6 months

## 2020-01-18 ENCOUNTER — Encounter: Admit: 2020-01-18 | Discharge: 2020-01-18 | Payer: MEDICARE

## 2020-01-18 DIAGNOSIS — I4811 Longstanding persistent atrial fibrillation: Secondary | ICD-10-CM

## 2020-01-23 ENCOUNTER — Encounter: Admit: 2020-01-23 | Discharge: 2020-01-23 | Payer: MEDICARE

## 2020-01-23 DIAGNOSIS — I4811 Longstanding persistent atrial fibrillation: Secondary | ICD-10-CM

## 2020-01-23 LAB — PROTIME INR (PT): Lab: 2.6 K/UL (ref 150–400)

## 2020-01-30 ENCOUNTER — Encounter: Admit: 2020-01-30 | Discharge: 2020-01-30 | Payer: MEDICARE

## 2020-02-06 ENCOUNTER — Encounter: Admit: 2020-02-06 | Discharge: 2020-02-06 | Payer: MEDICARE

## 2020-02-06 DIAGNOSIS — Z7901 Long term (current) use of anticoagulants: Secondary | ICD-10-CM

## 2020-02-06 LAB — PROTIME INR (PT): Lab: 2.8

## 2020-02-13 ENCOUNTER — Encounter: Admit: 2020-02-13 | Discharge: 2020-02-13 | Payer: MEDICARE

## 2020-02-13 DIAGNOSIS — Z7901 Long term (current) use of anticoagulants: Secondary | ICD-10-CM

## 2020-02-13 LAB — PROTIME INR (PT): Lab: 3 % — ABNORMAL LOW (ref 36–45)

## 2020-02-20 ENCOUNTER — Encounter: Admit: 2020-02-20 | Discharge: 2020-02-20 | Payer: MEDICARE

## 2020-02-20 DIAGNOSIS — Z7901 Long term (current) use of anticoagulants: Secondary | ICD-10-CM

## 2020-02-20 DIAGNOSIS — I4811 Longstanding persistent atrial fibrillation: Secondary | ICD-10-CM

## 2020-02-20 LAB — PROTIME INR (PT): Lab: 2.4

## 2020-02-27 ENCOUNTER — Encounter: Admit: 2020-02-27 | Discharge: 2020-02-27 | Payer: MEDICARE

## 2020-02-27 DIAGNOSIS — Z7901 Long term (current) use of anticoagulants: Secondary | ICD-10-CM

## 2020-02-27 LAB — PROTIME INR (PT): Lab: 2.6

## 2020-03-05 ENCOUNTER — Encounter: Admit: 2020-03-05 | Discharge: 2020-03-05 | Payer: MEDICARE

## 2020-03-05 DIAGNOSIS — Z7901 Long term (current) use of anticoagulants: Secondary | ICD-10-CM

## 2020-03-05 LAB — PROTIME INR (PT): Lab: 2

## 2020-03-12 ENCOUNTER — Encounter: Admit: 2020-03-12 | Discharge: 2020-03-12 | Payer: MEDICARE

## 2020-03-12 DIAGNOSIS — Z7901 Long term (current) use of anticoagulants: Secondary | ICD-10-CM

## 2020-03-12 LAB — PROTIME INR (PT): Lab: 2 K/UL (ref 150–400)

## 2020-03-19 ENCOUNTER — Encounter: Admit: 2020-03-19 | Discharge: 2020-03-19 | Payer: MEDICARE

## 2020-03-19 DIAGNOSIS — Z7901 Long term (current) use of anticoagulants: Secondary | ICD-10-CM

## 2020-03-19 LAB — PROTIME INR (PT): Lab: 2.6

## 2020-03-26 ENCOUNTER — Encounter: Admit: 2020-03-26 | Discharge: 2020-03-26 | Payer: MEDICARE

## 2020-03-26 DIAGNOSIS — Z7901 Long term (current) use of anticoagulants: Secondary | ICD-10-CM

## 2020-03-26 LAB — PROTIME INR (PT): Lab: 3.5 — ABNORMAL HIGH

## 2020-04-02 ENCOUNTER — Encounter: Admit: 2020-04-02 | Discharge: 2020-04-02 | Payer: MEDICARE

## 2020-04-02 DIAGNOSIS — Z7901 Long term (current) use of anticoagulants: Secondary | ICD-10-CM

## 2020-04-02 DIAGNOSIS — I4891 Unspecified atrial fibrillation: Secondary | ICD-10-CM

## 2020-04-02 NOTE — Progress Notes
Left msg for pt at cell # re: INR results and f/u plan. Chart notes indicated last week pt was taking antibiotic and some APAP re: dental work. In msg, advised if pt has completed his antibiotic and/or has only a day or two left, he should resume previously therapeutic warfarin dose at this time. (7.5mg  on Tuesday and Thursday and 5mg  all other days of the week.) Left cardiology [facility] nurse line  # for call back prn. No further needs identified at this time.

## 2020-04-09 ENCOUNTER — Encounter: Admit: 2020-04-09 | Discharge: 2020-04-09 | Payer: MEDICARE

## 2020-04-09 DIAGNOSIS — Z7901 Long term (current) use of anticoagulants: Secondary | ICD-10-CM

## 2020-04-09 LAB — PROTIME INR (PT): Lab: 2.2 FL (ref 7–11)

## 2020-04-16 ENCOUNTER — Encounter: Admit: 2020-04-16 | Discharge: 2020-04-16 | Payer: MEDICARE

## 2020-04-16 DIAGNOSIS — Z7901 Long term (current) use of anticoagulants: Secondary | ICD-10-CM

## 2020-04-16 LAB — PROTIME INR (PT): Lab: 3.4 % (ref >60)

## 2020-04-23 ENCOUNTER — Encounter: Admit: 2020-04-23 | Discharge: 2020-04-23 | Payer: MEDICARE

## 2020-04-23 DIAGNOSIS — Z7901 Long term (current) use of anticoagulants: Secondary | ICD-10-CM

## 2020-04-23 LAB — PROTIME INR (PT): Lab: 2.4

## 2020-04-23 NOTE — Progress Notes
Anticoag encounter for INR today already completed

## 2020-04-30 ENCOUNTER — Encounter: Admit: 2020-04-30 | Discharge: 2020-04-30 | Payer: MEDICARE

## 2020-04-30 DIAGNOSIS — Z7901 Long term (current) use of anticoagulants: Secondary | ICD-10-CM

## 2020-04-30 LAB — PROTIME INR (PT): Lab: 2.5

## 2020-05-07 ENCOUNTER — Encounter: Admit: 2020-05-07 | Discharge: 2020-05-07 | Payer: MEDICARE

## 2020-05-07 DIAGNOSIS — Z7901 Long term (current) use of anticoagulants: Secondary | ICD-10-CM

## 2020-05-07 LAB — PROTIME INR (PT): Lab: 2.7 % (ref 40–50)

## 2020-05-14 ENCOUNTER — Encounter: Admit: 2020-05-14 | Discharge: 2020-05-14 | Payer: MEDICARE

## 2020-05-14 DIAGNOSIS — Z7901 Long term (current) use of anticoagulants: Secondary | ICD-10-CM

## 2020-05-14 LAB — PROTIME INR (PT): Lab: 2.7

## 2020-05-21 ENCOUNTER — Encounter: Admit: 2020-05-21 | Discharge: 2020-05-21 | Payer: MEDICARE

## 2020-05-21 DIAGNOSIS — Z7901 Long term (current) use of anticoagulants: Secondary | ICD-10-CM

## 2020-05-21 LAB — PROTIME INR (PT): Lab: 3.4

## 2020-05-22 ENCOUNTER — Ambulatory Visit: Admit: 2020-05-22 | Discharge: 2020-05-22 | Payer: MEDICARE

## 2020-05-22 ENCOUNTER — Encounter: Admit: 2020-05-22 | Discharge: 2020-05-22 | Payer: MEDICARE

## 2020-05-22 DIAGNOSIS — I7771 Dissection of carotid artery: Secondary | ICD-10-CM

## 2020-05-22 DIAGNOSIS — I4891 Unspecified atrial fibrillation: Secondary | ICD-10-CM

## 2020-05-22 DIAGNOSIS — I272 Pulmonary hypertension, unspecified: Secondary | ICD-10-CM

## 2020-05-22 DIAGNOSIS — I10 Essential (primary) hypertension: Secondary | ICD-10-CM

## 2020-05-22 DIAGNOSIS — I350 Nonrheumatic aortic (valve) stenosis: Secondary | ICD-10-CM

## 2020-05-22 DIAGNOSIS — I358 Other nonrheumatic aortic valve disorders: Secondary | ICD-10-CM

## 2020-05-22 MED ORDER — PERFLUTREN LIPID MICROSPHERES 1.1 MG/ML IV SUSP
1-20 mL | Freq: Once | INTRAVENOUS | 0 refills | Status: CP | PRN
Start: 2020-05-22 — End: ?
  Administered 2020-05-22: 16:00:00 4 mL via INTRAVENOUS

## 2020-05-23 ENCOUNTER — Encounter: Admit: 2020-05-23 | Discharge: 2020-05-23 | Payer: MEDICARE

## 2020-05-23 NOTE — Telephone Encounter
Results discussed with patient.  Gabriel Davidson verbalized understanding, no questions at this time.  Gabriel Davidson states that he is feeling well overall.  He denies any swelling, shortness of breath, chest pain or palpitations.  He will call us if he develops any symptoms or has any problems prior to his scheduled follow up in April.

## 2020-05-23 NOTE — Telephone Encounter
-----   Message from Altamease Oiler, MD sent at 05/23/2020 11:58 AM CST -----  Gabriel Davidson mind reaching out to [facility] and let him know that his echocardiogram results are in.  His tricuspid valve regurgitation appears similar to his last echocardiogram.  There has been interval development of mitral valve regurgitation which is now moderate.  How is he feeling?  Any shortness of breath or swelling?Thanks!

## 2020-05-28 ENCOUNTER — Encounter: Admit: 2020-05-28 | Discharge: 2020-05-28 | Payer: MEDICARE

## 2020-05-28 DIAGNOSIS — Z7901 Long term (current) use of anticoagulants: Secondary | ICD-10-CM

## 2020-05-28 LAB — PROTIME INR (PT): Lab: 2.7

## 2020-06-04 ENCOUNTER — Encounter: Admit: 2020-06-04 | Discharge: 2020-06-04 | Payer: MEDICARE

## 2020-06-04 DIAGNOSIS — Z7901 Long term (current) use of anticoagulants: Secondary | ICD-10-CM

## 2020-06-04 LAB — PROTIME INR (PT): Lab: 3.1 K/UL (ref 0–0.45)

## 2020-06-11 ENCOUNTER — Encounter: Admit: 2020-06-11 | Discharge: 2020-06-11 | Payer: MEDICARE

## 2020-06-11 DIAGNOSIS — Z7901 Long term (current) use of anticoagulants: Secondary | ICD-10-CM

## 2020-06-11 LAB — PROTIME INR (PT): Lab: 3.2

## 2020-06-18 ENCOUNTER — Encounter: Admit: 2020-06-18 | Discharge: 2020-06-18 | Payer: MEDICARE

## 2020-06-18 DIAGNOSIS — Z7901 Long term (current) use of anticoagulants: Secondary | ICD-10-CM

## 2020-06-18 LAB — PROTIME INR (PT): Lab: 2.2

## 2020-06-25 LAB — PROTIME INR (PT): Lab: 2.4

## 2020-07-02 ENCOUNTER — Encounter: Admit: 2020-07-02 | Discharge: 2020-07-02 | Payer: MEDICARE

## 2020-07-02 DIAGNOSIS — Z7901 Long term (current) use of anticoagulants: Secondary | ICD-10-CM

## 2020-07-02 MED ORDER — ATORVASTATIN 80 MG PO TAB
ORAL_TABLET | Freq: Every day | 1 refills | Status: AC
Start: 2020-07-02 — End: ?

## 2020-07-03 ENCOUNTER — Encounter: Admit: 2020-07-03 | Discharge: 2020-07-03 | Payer: MEDICARE

## 2020-07-03 DIAGNOSIS — I4811 Longstanding persistent atrial fibrillation: Secondary | ICD-10-CM

## 2020-07-03 NOTE — Progress Notes
error 

## 2020-07-09 ENCOUNTER — Encounter: Admit: 2020-07-09 | Discharge: 2020-07-09 | Payer: MEDICARE

## 2020-07-09 DIAGNOSIS — Z7901 Long term (current) use of anticoagulants: Secondary | ICD-10-CM

## 2020-07-09 LAB — PROTIME INR (PT): Lab: 2.7

## 2020-07-16 ENCOUNTER — Encounter: Admit: 2020-07-16 | Discharge: 2020-07-16 | Payer: MEDICARE

## 2020-07-16 DIAGNOSIS — Z7901 Long term (current) use of anticoagulants: Secondary | ICD-10-CM

## 2020-07-16 LAB — PROTIME INR (PT): Lab: 2.1

## 2020-07-22 ENCOUNTER — Encounter: Admit: 2020-07-22 | Discharge: 2020-07-22 | Payer: MEDICARE

## 2020-07-22 MED ORDER — WARFARIN 5 MG PO TAB
ORAL_TABLET | Freq: Every day | ORAL | 3 refills | 90.00000 days | Status: AC
Start: 2020-07-22 — End: ?

## 2020-07-23 ENCOUNTER — Encounter: Admit: 2020-07-23 | Discharge: 2020-07-23 | Payer: MEDICARE

## 2020-07-23 DIAGNOSIS — Z7901 Long term (current) use of anticoagulants: Secondary | ICD-10-CM

## 2020-07-23 LAB — PROTIME INR (PT): Lab: 2.6

## 2020-07-30 ENCOUNTER — Encounter

## 2020-07-30 DIAGNOSIS — Z7901 Long term (current) use of anticoagulants: Secondary | ICD-10-CM

## 2020-07-30 LAB — PROTIME INR (PT): Lab: 2.1

## 2020-08-06 ENCOUNTER — Encounter: Admit: 2020-08-06 | Discharge: 2020-08-06 | Payer: MEDICARE

## 2020-08-06 DIAGNOSIS — Z7901 Long term (current) use of anticoagulants: Secondary | ICD-10-CM

## 2020-08-06 LAB — PROTIME INR (PT): Lab: 2.6

## 2020-08-13 ENCOUNTER — Encounter: Admit: 2020-08-13 | Discharge: 2020-08-13 | Payer: MEDICARE

## 2020-08-13 DIAGNOSIS — Z7901 Long term (current) use of anticoagulants: Secondary | ICD-10-CM

## 2020-08-20 ENCOUNTER — Encounter: Admit: 2020-08-20 | Discharge: 2020-08-20 | Payer: MEDICARE

## 2020-08-20 DIAGNOSIS — Z7901 Long term (current) use of anticoagulants: Secondary | ICD-10-CM

## 2020-08-20 LAB — PROTIME INR (PT): Lab: 3.4 — ABNORMAL HIGH (ref 2–3)

## 2020-08-27 ENCOUNTER — Encounter: Admit: 2020-08-27 | Discharge: 2020-08-27 | Payer: MEDICARE

## 2020-08-27 DIAGNOSIS — Z7901 Long term (current) use of anticoagulants: Secondary | ICD-10-CM

## 2020-08-27 LAB — PROTIME INR (PT): Lab: 1.7 K/UL (ref 1.8–7.0)

## 2020-09-03 ENCOUNTER — Encounter: Admit: 2020-09-03 | Discharge: 2020-09-03 | Payer: MEDICARE

## 2020-09-03 DIAGNOSIS — Z7901 Long term (current) use of anticoagulants: Secondary | ICD-10-CM

## 2020-09-03 LAB — PROTIME INR (PT): Lab: 2.1

## 2020-09-10 ENCOUNTER — Encounter: Admit: 2020-09-10 | Discharge: 2020-09-10 | Payer: MEDICARE

## 2020-09-10 DIAGNOSIS — Z7901 Long term (current) use of anticoagulants: Secondary | ICD-10-CM

## 2020-09-10 LAB — PROTIME INR (PT): Lab: 3.1 FL — ABNORMAL HIGH (ref 2–3)

## 2020-09-17 ENCOUNTER — Encounter: Admit: 2020-09-17 | Discharge: 2020-09-17 | Payer: MEDICARE

## 2020-09-17 DIAGNOSIS — Z7901 Long term (current) use of anticoagulants: Secondary | ICD-10-CM

## 2020-09-17 LAB — PROTIME INR (PT): Lab: 2.4

## 2020-09-23 ENCOUNTER — Encounter: Admit: 2020-09-23 | Discharge: 2020-09-23 | Payer: MEDICARE

## 2020-09-24 ENCOUNTER — Encounter: Admit: 2020-09-24 | Discharge: 2020-09-24 | Payer: MEDICARE

## 2020-09-24 DIAGNOSIS — I714 Abdominal aortic aneurysm, without rupture: Secondary | ICD-10-CM

## 2020-09-24 DIAGNOSIS — I7771 Dissection of carotid artery: Secondary | ICD-10-CM

## 2020-09-24 DIAGNOSIS — I4891 Unspecified atrial fibrillation: Secondary | ICD-10-CM

## 2020-09-24 DIAGNOSIS — I358 Other nonrheumatic aortic valve disorders: Secondary | ICD-10-CM

## 2020-09-24 DIAGNOSIS — Z7901 Long term (current) use of anticoagulants: Secondary | ICD-10-CM

## 2020-09-24 DIAGNOSIS — E785 Hyperlipidemia, unspecified: Secondary | ICD-10-CM

## 2020-09-24 LAB — PROTIME INR (PT): Lab: 3

## 2020-09-24 NOTE — Patient Instructions
Thank you for visiting our office today.    Continue the same medications as you have been doing.          We will be pursuing the following tests after your appointment today:       Orders Placed This Encounter    ECG Today (all locations)    2D + DOPPLER ECHO         We will plan to see you back in 6 months.  Please call us in the meantime with any questions or concerns.        Please allow 5-7 business days for our providers to review your results. All normal results will go to MyChart. If you do not have Mychart, it is strongly recommended to get this so you can easily view all your results. If you do not have mychart, we will attempt to call you once with normal lab and testing results. If we cannot reach you by phone with normal results, we will send you a letter.  If you have not heard the results of your testing after one week please give us a call.       Your Cardiovascular Medicine [place] Team (Steve, Lisa, Jamie and Vernetta Dizdarevic)  phone number is [phone].

## 2020-10-01 ENCOUNTER — Encounter: Admit: 2020-10-01 | Discharge: 2020-10-01 | Payer: MEDICARE

## 2020-10-01 DIAGNOSIS — Z7901 Long term (current) use of anticoagulants: Secondary | ICD-10-CM

## 2020-10-01 LAB — PROTIME INR (PT): Lab: 2

## 2020-10-08 ENCOUNTER — Encounter: Admit: 2020-10-08 | Discharge: 2020-10-08 | Payer: MEDICARE

## 2020-10-08 DIAGNOSIS — Z7901 Long term (current) use of anticoagulants: Secondary | ICD-10-CM

## 2020-10-08 LAB — PROTIME INR (PT): INR: 2.5

## 2020-10-15 ENCOUNTER — Encounter: Admit: 2020-10-15 | Discharge: 2020-10-15 | Payer: MEDICARE

## 2020-10-15 DIAGNOSIS — Z7901 Long term (current) use of anticoagulants: Secondary | ICD-10-CM

## 2020-10-15 LAB — PROTIME INR (PT): INR: 2.9 g/dL — ABNORMAL LOW (ref 13.5–16.5)

## 2020-10-22 ENCOUNTER — Encounter: Admit: 2020-10-22 | Discharge: 2020-10-22 | Payer: MEDICARE

## 2020-10-22 DIAGNOSIS — Z7901 Long term (current) use of anticoagulants: Secondary | ICD-10-CM

## 2020-10-22 LAB — PROTIME INR (PT): INR: 1.9

## 2020-10-25 ENCOUNTER — Encounter: Admit: 2020-10-25 | Discharge: 2020-10-25 | Payer: MEDICARE

## 2020-10-25 ENCOUNTER — Ambulatory Visit: Admit: 2020-10-25 | Discharge: 2020-10-25 | Payer: MEDICARE

## 2020-10-25 DIAGNOSIS — I714 Abdominal aortic aneurysm, without rupture: Secondary | ICD-10-CM

## 2020-10-25 DIAGNOSIS — I358 Other nonrheumatic aortic valve disorders: Secondary | ICD-10-CM

## 2020-10-25 DIAGNOSIS — I4891 Unspecified atrial fibrillation: Secondary | ICD-10-CM

## 2020-10-25 DIAGNOSIS — I7771 Dissection of carotid artery: Secondary | ICD-10-CM

## 2020-10-25 MED ORDER — PERFLUTREN LIPID MICROSPHERES 1.1 MG/ML IV SUSP
1-10 mL | Freq: Once | INTRAVENOUS | 0 refills | Status: CP | PRN
Start: 2020-10-25 — End: ?

## 2020-10-25 NOTE — Telephone Encounter
Discussed results per results note from Dr. Sandria Manly. Patient does not have any further questions or concerns at this time.

## 2020-10-25 NOTE — Telephone Encounter
-----   Message from Altamease Oiler, MD sent at 10/25/2020  1:09 PM CDT -----  Gabriel Davidson,    Joint reaching out to [facility] and letting him know that his echocardiogram demonstrated normal heart pump function.  No significant valve issues. His carotid ultrasound did not redemonstrate the dissection, nor did his abdominal ultrasound.  Overall everything looked great!    Thanks!

## 2020-10-29 ENCOUNTER — Encounter: Admit: 2020-10-29 | Discharge: 2020-10-29 | Payer: MEDICARE

## 2020-10-29 DIAGNOSIS — Z7901 Long term (current) use of anticoagulants: Secondary | ICD-10-CM

## 2020-10-29 LAB — PROTIME INR (PT): INR: 1.8 % (ref 41–77)

## 2020-11-05 ENCOUNTER — Encounter: Admit: 2020-11-05 | Discharge: 2020-11-05 | Payer: MEDICARE

## 2020-11-05 DIAGNOSIS — Z7901 Long term (current) use of anticoagulants: Secondary | ICD-10-CM

## 2020-11-05 LAB — PROTIME INR (PT): INR: 3.2

## 2020-11-12 ENCOUNTER — Encounter: Admit: 2020-11-12 | Discharge: 2020-11-12 | Payer: MEDICARE

## 2020-11-12 DIAGNOSIS — Z7901 Long term (current) use of anticoagulants: Secondary | ICD-10-CM

## 2020-11-12 LAB — PROTIME INR (PT): INR: 2.5 U/L (ref 7–56)

## 2020-11-19 ENCOUNTER — Encounter: Admit: 2020-11-19 | Discharge: 2020-11-19 | Payer: MEDICARE

## 2020-11-19 DIAGNOSIS — Z7901 Long term (current) use of anticoagulants: Secondary | ICD-10-CM

## 2020-11-19 LAB — PROTIME INR (PT): INR: 3

## 2020-11-26 ENCOUNTER — Encounter: Admit: 2020-11-26 | Discharge: 2020-11-26 | Payer: MEDICARE

## 2020-11-26 DIAGNOSIS — Z7901 Long term (current) use of anticoagulants: Secondary | ICD-10-CM

## 2020-11-26 LAB — PROTIME INR (PT): INR: 2.8 K/UL (ref 1.8–7.0)

## 2020-12-03 ENCOUNTER — Encounter: Admit: 2020-12-03 | Discharge: 2020-12-03 | Payer: MEDICARE

## 2020-12-03 DIAGNOSIS — Z7901 Long term (current) use of anticoagulants: Secondary | ICD-10-CM

## 2020-12-03 LAB — PROTIME INR (PT): INR: 2.8

## 2020-12-10 ENCOUNTER — Encounter: Admit: 2020-12-10 | Discharge: 2020-12-10 | Payer: MEDICARE

## 2020-12-10 DIAGNOSIS — I4891 Unspecified atrial fibrillation: Secondary | ICD-10-CM

## 2020-12-10 DIAGNOSIS — Z7901 Long term (current) use of anticoagulants: Secondary | ICD-10-CM

## 2020-12-10 LAB — PROTIME INR (PT): INR: 3.4 — ABNORMAL HIGH

## 2020-12-18 ENCOUNTER — Encounter: Admit: 2020-12-18 | Discharge: 2020-12-18 | Payer: MEDICARE

## 2020-12-18 DIAGNOSIS — Z7901 Long term (current) use of anticoagulants: Secondary | ICD-10-CM

## 2020-12-18 LAB — PROTIME INR (PT): INR: 1.7

## 2020-12-24 ENCOUNTER — Encounter: Admit: 2020-12-24 | Discharge: 2020-12-24 | Payer: MEDICARE

## 2020-12-24 DIAGNOSIS — Z7901 Long term (current) use of anticoagulants: Secondary | ICD-10-CM

## 2020-12-24 LAB — PROTIME INR (PT): INR: 1.8

## 2020-12-31 ENCOUNTER — Encounter: Admit: 2020-12-31 | Discharge: 2020-12-31 | Payer: MEDICARE

## 2020-12-31 DIAGNOSIS — Z7901 Long term (current) use of anticoagulants: Secondary | ICD-10-CM

## 2020-12-31 LAB — PROTIME INR (PT): INR: 2.2

## 2021-01-02 ENCOUNTER — Encounter: Admit: 2021-01-02 | Discharge: 2021-01-02 | Payer: MEDICARE

## 2021-01-02 MED ORDER — ATORVASTATIN 80 MG PO TAB
ORAL_TABLET | Freq: Every day | 1 refills | Status: AC
Start: 2021-01-02 — End: ?

## 2021-01-07 ENCOUNTER — Encounter: Admit: 2021-01-07 | Discharge: 2021-01-07 | Payer: MEDICARE

## 2021-01-07 DIAGNOSIS — Z7901 Long term (current) use of anticoagulants: Secondary | ICD-10-CM

## 2021-01-07 LAB — PROTIME INR (PT): INR: 2.7 (ref 1.005–1.030)

## 2021-01-14 ENCOUNTER — Encounter: Admit: 2021-01-14 | Discharge: 2021-01-14 | Payer: MEDICARE

## 2021-01-14 DIAGNOSIS — Z7901 Long term (current) use of anticoagulants: Secondary | ICD-10-CM

## 2021-01-14 LAB — PROTIME INR (PT): INR: 3.2 — ABNORMAL HIGH (ref 2–3)

## 2021-01-21 ENCOUNTER — Encounter: Admit: 2021-01-21 | Discharge: 2021-01-21 | Payer: MEDICARE

## 2021-01-21 DIAGNOSIS — Z7901 Long term (current) use of anticoagulants: Secondary | ICD-10-CM

## 2021-01-21 LAB — PROTIME INR (PT): INR: 2.3

## 2021-01-23 ENCOUNTER — Encounter: Admit: 2021-01-23 | Discharge: 2021-01-23 | Payer: MEDICARE

## 2021-01-23 MED ORDER — METOPROLOL TARTRATE 25 MG PO TAB
ORAL_TABLET | Freq: Two times a day (BID) | ORAL | 3 refills | 90.00000 days | Status: AC
Start: 2021-01-23 — End: ?

## 2021-01-28 ENCOUNTER — Encounter: Admit: 2021-01-28 | Discharge: 2021-01-28 | Payer: MEDICARE

## 2021-01-28 DIAGNOSIS — Z7901 Long term (current) use of anticoagulants: Secondary | ICD-10-CM

## 2021-01-28 LAB — PROTIME INR (PT): INR: 2

## 2021-02-04 ENCOUNTER — Encounter: Admit: 2021-02-04 | Discharge: 2021-02-04 | Payer: MEDICARE

## 2021-02-04 DIAGNOSIS — Z7901 Long term (current) use of anticoagulants: Secondary | ICD-10-CM

## 2021-02-04 LAB — PROTIME INR (PT): INR: 6.2 — ABNORMAL HIGH

## 2021-02-06 ENCOUNTER — Encounter: Admit: 2021-02-06 | Discharge: 2021-02-06 | Payer: MEDICARE

## 2021-02-06 DIAGNOSIS — I7771 Dissection of carotid artery: Secondary | ICD-10-CM

## 2021-02-06 DIAGNOSIS — I350 Nonrheumatic aortic (valve) stenosis: Secondary | ICD-10-CM

## 2021-02-06 DIAGNOSIS — I4891 Unspecified atrial fibrillation: Secondary | ICD-10-CM

## 2021-02-06 DIAGNOSIS — R011 Cardiac murmur, unspecified: Secondary | ICD-10-CM

## 2021-02-06 DIAGNOSIS — I10 Essential (primary) hypertension: Secondary | ICD-10-CM

## 2021-02-06 LAB — PROTIME INR (PT): INR: 5.2 — ABNORMAL HIGH (ref 2–3)

## 2021-02-06 NOTE — Progress Notes
02/06/2021 10:17 AM INR still elevated. Discussed with [facility]. Patient is to hold again tonight and recheck INR tomorrow before the weekend. Discussed plan with patient. Patient has no questions at this time. Florene Route, RN    02/04/2021 8:57 AM   Discussed INR results with patient. Patient reports he tested twice with two different package of strips. Patient states the only changes to medication or dietary is he has been drinking a glass of grapefruit juice daily for the past few days. Patient is to stop drinking the juice, hold dosage for 2 days and recheck on Thursday. Plan discussed with [facility] and agrees. Patient verbalizes understanding and will call with any bleeding issues or concerns. [place] Reek, RN

## 2021-02-07 ENCOUNTER — Encounter: Admit: 2021-02-07 | Discharge: 2021-02-07 | Payer: MEDICARE

## 2021-02-07 DIAGNOSIS — R011 Cardiac murmur, unspecified: Secondary | ICD-10-CM

## 2021-02-07 DIAGNOSIS — I7771 Dissection of carotid artery: Secondary | ICD-10-CM

## 2021-02-07 DIAGNOSIS — I4891 Unspecified atrial fibrillation: Secondary | ICD-10-CM

## 2021-02-07 DIAGNOSIS — I350 Nonrheumatic aortic (valve) stenosis: Secondary | ICD-10-CM

## 2021-02-07 DIAGNOSIS — I10 Essential (primary) hypertension: Secondary | ICD-10-CM

## 2021-02-07 LAB — PROTIME INR (PT): INR: 3.1

## 2021-02-11 ENCOUNTER — Encounter: Admit: 2021-02-11 | Discharge: 2021-02-11 | Payer: MEDICARE

## 2021-02-11 DIAGNOSIS — I350 Nonrheumatic aortic (valve) stenosis: Secondary | ICD-10-CM

## 2021-02-11 DIAGNOSIS — I10 Essential (primary) hypertension: Secondary | ICD-10-CM

## 2021-02-11 DIAGNOSIS — I4891 Unspecified atrial fibrillation: Secondary | ICD-10-CM

## 2021-02-11 DIAGNOSIS — I7771 Dissection of carotid artery: Secondary | ICD-10-CM

## 2021-02-11 DIAGNOSIS — R011 Cardiac murmur, unspecified: Secondary | ICD-10-CM

## 2021-02-11 LAB — PROTIME INR (PT): INR: 2.2

## 2021-02-18 ENCOUNTER — Encounter: Admit: 2021-02-18 | Discharge: 2021-02-18 | Payer: MEDICARE

## 2021-02-18 DIAGNOSIS — I4891 Unspecified atrial fibrillation: Secondary | ICD-10-CM

## 2021-02-18 DIAGNOSIS — I350 Nonrheumatic aortic (valve) stenosis: Secondary | ICD-10-CM

## 2021-02-18 DIAGNOSIS — I7771 Dissection of carotid artery: Secondary | ICD-10-CM

## 2021-02-18 DIAGNOSIS — I10 Essential (primary) hypertension: Secondary | ICD-10-CM

## 2021-02-18 DIAGNOSIS — R011 Cardiac murmur, unspecified: Secondary | ICD-10-CM

## 2021-02-18 LAB — PROTIME INR (PT): INR: 2.3 K/UL (ref 150–400)

## 2021-02-25 ENCOUNTER — Encounter: Admit: 2021-02-25 | Discharge: 2021-02-25 | Payer: MEDICARE

## 2021-02-25 DIAGNOSIS — I10 Essential (primary) hypertension: Secondary | ICD-10-CM

## 2021-02-25 DIAGNOSIS — I350 Nonrheumatic aortic (valve) stenosis: Secondary | ICD-10-CM

## 2021-02-25 DIAGNOSIS — R011 Cardiac murmur, unspecified: Secondary | ICD-10-CM

## 2021-02-25 DIAGNOSIS — I4891 Unspecified atrial fibrillation: Secondary | ICD-10-CM

## 2021-02-25 DIAGNOSIS — I7771 Dissection of carotid artery: Secondary | ICD-10-CM

## 2021-02-25 LAB — PROTIME INR (PT): INR: 2.1 g/dL (ref 6.0–8.0)

## 2021-03-04 ENCOUNTER — Encounter: Admit: 2021-03-04 | Discharge: 2021-03-04 | Payer: MEDICARE

## 2021-03-04 DIAGNOSIS — I10 Essential (primary) hypertension: Secondary | ICD-10-CM

## 2021-03-04 DIAGNOSIS — R011 Cardiac murmur, unspecified: Secondary | ICD-10-CM

## 2021-03-04 DIAGNOSIS — I7771 Dissection of carotid artery: Secondary | ICD-10-CM

## 2021-03-04 DIAGNOSIS — I4891 Unspecified atrial fibrillation: Secondary | ICD-10-CM

## 2021-03-04 DIAGNOSIS — I350 Nonrheumatic aortic (valve) stenosis: Secondary | ICD-10-CM

## 2021-03-04 LAB — PROTIME INR (PT): INR: 1.9 ug/mg — ABNORMAL LOW (ref 2–3)

## 2021-03-11 ENCOUNTER — Encounter: Admit: 2021-03-11 | Discharge: 2021-03-11 | Payer: MEDICARE

## 2021-03-11 DIAGNOSIS — I4891 Unspecified atrial fibrillation: Secondary | ICD-10-CM

## 2021-03-11 DIAGNOSIS — R011 Cardiac murmur, unspecified: Secondary | ICD-10-CM

## 2021-03-11 DIAGNOSIS — I7771 Dissection of carotid artery: Secondary | ICD-10-CM

## 2021-03-11 DIAGNOSIS — I350 Nonrheumatic aortic (valve) stenosis: Secondary | ICD-10-CM

## 2021-03-11 DIAGNOSIS — I10 Essential (primary) hypertension: Secondary | ICD-10-CM

## 2021-03-11 LAB — PROTIME INR (PT): INR: 2.3 K/UL (ref 0–0.45)

## 2021-03-18 ENCOUNTER — Encounter: Admit: 2021-03-18 | Discharge: 2021-03-18 | Payer: MEDICARE

## 2021-03-18 DIAGNOSIS — I350 Nonrheumatic aortic (valve) stenosis: Secondary | ICD-10-CM

## 2021-03-18 DIAGNOSIS — I4891 Unspecified atrial fibrillation: Secondary | ICD-10-CM

## 2021-03-18 DIAGNOSIS — I7771 Dissection of carotid artery: Secondary | ICD-10-CM

## 2021-03-18 DIAGNOSIS — I10 Essential (primary) hypertension: Secondary | ICD-10-CM

## 2021-03-18 DIAGNOSIS — R011 Cardiac murmur, unspecified: Secondary | ICD-10-CM

## 2021-03-18 LAB — PROTIME INR (PT): INR: 2.3 (ref 0.025–0.148)

## 2021-03-25 ENCOUNTER — Encounter: Admit: 2021-03-25 | Discharge: 2021-03-25 | Payer: MEDICARE

## 2021-03-25 DIAGNOSIS — R011 Cardiac murmur, unspecified: Secondary | ICD-10-CM

## 2021-03-25 DIAGNOSIS — I358 Other nonrheumatic aortic valve disorders: Secondary | ICD-10-CM

## 2021-03-25 DIAGNOSIS — I4891 Unspecified atrial fibrillation: Secondary | ICD-10-CM

## 2021-03-25 DIAGNOSIS — I7771 Dissection of carotid artery: Secondary | ICD-10-CM

## 2021-03-25 DIAGNOSIS — E785 Hyperlipidemia, unspecified: Secondary | ICD-10-CM

## 2021-03-25 DIAGNOSIS — I10 Essential (primary) hypertension: Secondary | ICD-10-CM

## 2021-03-25 DIAGNOSIS — I350 Nonrheumatic aortic (valve) stenosis: Secondary | ICD-10-CM

## 2021-03-25 DIAGNOSIS — R0989 Other specified symptoms and signs involving the circulatory and respiratory systems: Secondary | ICD-10-CM

## 2021-03-25 LAB — PROTIME INR (PT): INR: 2.6 K/UL (ref 150–400)

## 2021-03-25 NOTE — Patient Instructions
Thank you for visiting our office today.    We would like to make the following medication adjustments:  NONE       Otherwise continue the same medications as you have been doing.          We will be pursuing the following tests after your appointment today:       Orders Placed This Encounter    ECG 12-LEAD         We will plan to see you back in 12 months.  Please call us in the meantime with any questions or concerns.        Please allow 5-7 business days for our providers to review your results. All normal results will go to MyChart. If you do not have Mychart, it is strongly recommended to get this so you can easily view all your results. If you do not have mychart, we will attempt to call you once with normal lab and testing results. If we cannot reach you by phone with normal results, we will send you a letter.  If you have not heard the results of your testing after one week please give us a call.       Your Cardiovascular Medicine [place] Team (Steve, Lisa, Jamie, Melanie, and Hasel Janish)  phone number is [phone].

## 2021-04-01 ENCOUNTER — Encounter: Admit: 2021-04-01 | Discharge: 2021-04-01 | Payer: MEDICARE

## 2021-04-01 DIAGNOSIS — I4891 Unspecified atrial fibrillation: Secondary | ICD-10-CM

## 2021-04-01 DIAGNOSIS — R011 Cardiac murmur, unspecified: Secondary | ICD-10-CM

## 2021-04-01 DIAGNOSIS — I7771 Dissection of carotid artery: Secondary | ICD-10-CM

## 2021-04-01 DIAGNOSIS — I10 Essential (primary) hypertension: Secondary | ICD-10-CM

## 2021-04-01 DIAGNOSIS — I350 Nonrheumatic aortic (valve) stenosis: Secondary | ICD-10-CM

## 2021-04-01 LAB — PROTIME INR (PT): INR: 2.8 pg (ref 26–34)

## 2021-04-08 ENCOUNTER — Encounter: Admit: 2021-04-08 | Discharge: 2021-04-08 | Payer: MEDICARE

## 2021-04-08 DIAGNOSIS — I10 Essential (primary) hypertension: Secondary | ICD-10-CM

## 2021-04-08 DIAGNOSIS — I4891 Unspecified atrial fibrillation: Secondary | ICD-10-CM

## 2021-04-08 DIAGNOSIS — I7771 Dissection of carotid artery: Secondary | ICD-10-CM

## 2021-04-08 DIAGNOSIS — I350 Nonrheumatic aortic (valve) stenosis: Secondary | ICD-10-CM

## 2021-04-08 DIAGNOSIS — R011 Cardiac murmur, unspecified: Secondary | ICD-10-CM

## 2021-04-08 LAB — PROTIME INR (PT): INR: 2.4

## 2021-04-08 NOTE — Progress Notes
INR 2.4. Pt not called per patient request since INR within range. Will continue same dose schedule as listed in dose tracker above (5mg /day). Recheck in 1 week.

## 2021-04-15 ENCOUNTER — Encounter: Admit: 2021-04-15 | Discharge: 2021-04-15 | Payer: MEDICARE

## 2021-04-15 DIAGNOSIS — I10 Essential (primary) hypertension: Secondary | ICD-10-CM

## 2021-04-15 DIAGNOSIS — I4891 Unspecified atrial fibrillation: Secondary | ICD-10-CM

## 2021-04-15 DIAGNOSIS — I7771 Dissection of carotid artery: Secondary | ICD-10-CM

## 2021-04-15 DIAGNOSIS — I350 Nonrheumatic aortic (valve) stenosis: Secondary | ICD-10-CM

## 2021-04-15 DIAGNOSIS — R011 Cardiac murmur, unspecified: Secondary | ICD-10-CM

## 2021-04-15 LAB — PROTIME INR (PT): INR: 2.7

## 2021-04-22 ENCOUNTER — Encounter: Admit: 2021-04-22 | Discharge: 2021-04-22 | Payer: MEDICARE

## 2021-04-22 DIAGNOSIS — I350 Nonrheumatic aortic (valve) stenosis: Secondary | ICD-10-CM

## 2021-04-22 DIAGNOSIS — I7771 Dissection of carotid artery: Secondary | ICD-10-CM

## 2021-04-22 DIAGNOSIS — I4891 Unspecified atrial fibrillation: Secondary | ICD-10-CM

## 2021-04-22 DIAGNOSIS — I10 Essential (primary) hypertension: Secondary | ICD-10-CM

## 2021-04-22 DIAGNOSIS — R011 Cardiac murmur, unspecified: Secondary | ICD-10-CM

## 2021-04-22 LAB — PROTIME INR (PT): INR: 2.3

## 2021-04-29 ENCOUNTER — Encounter: Admit: 2021-04-29 | Discharge: 2021-04-29 | Payer: MEDICARE

## 2021-04-29 DIAGNOSIS — I350 Nonrheumatic aortic (valve) stenosis: Secondary | ICD-10-CM

## 2021-04-29 DIAGNOSIS — I7771 Dissection of carotid artery: Secondary | ICD-10-CM

## 2021-04-29 DIAGNOSIS — I10 Essential (primary) hypertension: Secondary | ICD-10-CM

## 2021-04-29 DIAGNOSIS — R011 Cardiac murmur, unspecified: Secondary | ICD-10-CM

## 2021-04-29 DIAGNOSIS — I4891 Unspecified atrial fibrillation: Secondary | ICD-10-CM

## 2021-04-29 LAB — PROTIME INR (PT): INR: 2.8

## 2021-05-06 ENCOUNTER — Encounter: Admit: 2021-05-06 | Discharge: 2021-05-06 | Payer: MEDICARE

## 2021-05-06 DIAGNOSIS — I4891 Unspecified atrial fibrillation: Secondary | ICD-10-CM

## 2021-05-06 DIAGNOSIS — R011 Cardiac murmur, unspecified: Secondary | ICD-10-CM

## 2021-05-06 DIAGNOSIS — I10 Essential (primary) hypertension: Secondary | ICD-10-CM

## 2021-05-06 DIAGNOSIS — I350 Nonrheumatic aortic (valve) stenosis: Secondary | ICD-10-CM

## 2021-05-06 DIAGNOSIS — I7771 Dissection of carotid artery: Secondary | ICD-10-CM

## 2021-05-06 LAB — PROTIME INR (PT): INR: 2.4 M/UL (ref 4.4–5.5)

## 2021-05-13 ENCOUNTER — Encounter: Admit: 2021-05-13 | Discharge: 2021-05-13 | Payer: MEDICARE

## 2021-05-13 DIAGNOSIS — R011 Cardiac murmur, unspecified: Secondary | ICD-10-CM

## 2021-05-13 DIAGNOSIS — I7771 Dissection of carotid artery: Secondary | ICD-10-CM

## 2021-05-13 DIAGNOSIS — I10 Essential (primary) hypertension: Secondary | ICD-10-CM

## 2021-05-13 DIAGNOSIS — I4891 Unspecified atrial fibrillation: Secondary | ICD-10-CM

## 2021-05-13 DIAGNOSIS — I350 Nonrheumatic aortic (valve) stenosis: Secondary | ICD-10-CM

## 2021-05-13 LAB — PROTIME INR (PT): INR: 2.4

## 2021-05-20 ENCOUNTER — Encounter: Admit: 2021-05-20 | Discharge: 2021-05-20 | Payer: MEDICARE

## 2021-05-20 DIAGNOSIS — I4891 Unspecified atrial fibrillation: Secondary | ICD-10-CM

## 2021-05-27 ENCOUNTER — Encounter: Admit: 2021-05-27 | Discharge: 2021-05-27 | Payer: MEDICARE

## 2021-05-27 DIAGNOSIS — I4891 Unspecified atrial fibrillation: Secondary | ICD-10-CM

## 2021-05-29 ENCOUNTER — Encounter: Admit: 2021-05-29 | Discharge: 2021-05-29 | Payer: MEDICARE

## 2021-05-29 NOTE — Telephone Encounter
-----   Message from Altamease Oiler, MD sent at 05/29/2021 10:46 AM CST -----  Regarding: RE: dental extraction  I believe his CHA2DS2-VASc score is 37 (age, CAD, hypertension).  Should be okay to hold Coumadin for 3 days prior to extraction and restart that evening if dentist is okay with it.  No bridging is required.  Thank you  ----- Message -----  From: [name]  Sent: 05/29/2021  10:44 AM CST  To: Altamease Oiler, MD  Subject: dental extraction                                Dentist wants to pull 3 teeth requesting 3 day hold of coumadin prior to extraction.  OK to hold?  Bridging required?  Sent txt as well  Thanks  Gabriel Davidson

## 2021-06-03 ENCOUNTER — Encounter: Admit: 2021-06-03 | Discharge: 2021-06-03 | Payer: MEDICARE

## 2021-06-03 DIAGNOSIS — I350 Nonrheumatic aortic (valve) stenosis: Secondary | ICD-10-CM

## 2021-06-03 DIAGNOSIS — I4891 Unspecified atrial fibrillation: Secondary | ICD-10-CM

## 2021-06-03 DIAGNOSIS — I10 Essential (primary) hypertension: Secondary | ICD-10-CM

## 2021-06-03 DIAGNOSIS — R011 Cardiac murmur, unspecified: Secondary | ICD-10-CM

## 2021-06-03 DIAGNOSIS — I7771 Dissection of carotid artery: Secondary | ICD-10-CM

## 2021-06-03 LAB — PROTIME INR (PT): INR: 3 U/L (ref 7–56)

## 2021-06-10 ENCOUNTER — Encounter: Admit: 2021-06-10 | Discharge: 2021-06-10 | Payer: MEDICARE

## 2021-06-10 DIAGNOSIS — I4891 Unspecified atrial fibrillation: Secondary | ICD-10-CM

## 2021-06-10 DIAGNOSIS — I7771 Dissection of carotid artery: Secondary | ICD-10-CM

## 2021-06-10 DIAGNOSIS — I10 Essential (primary) hypertension: Secondary | ICD-10-CM

## 2021-06-10 DIAGNOSIS — R011 Cardiac murmur, unspecified: Secondary | ICD-10-CM

## 2021-06-10 DIAGNOSIS — I350 Nonrheumatic aortic (valve) stenosis: Secondary | ICD-10-CM

## 2021-06-10 LAB — PROTIME INR (PT): INR: 2.4

## 2021-06-17 ENCOUNTER — Encounter: Admit: 2021-06-17 | Discharge: 2021-06-17 | Payer: MEDICARE

## 2021-06-17 DIAGNOSIS — I7771 Dissection of carotid artery: Secondary | ICD-10-CM

## 2021-06-17 DIAGNOSIS — R011 Cardiac murmur, unspecified: Secondary | ICD-10-CM

## 2021-06-17 DIAGNOSIS — I350 Nonrheumatic aortic (valve) stenosis: Secondary | ICD-10-CM

## 2021-06-17 DIAGNOSIS — I4891 Unspecified atrial fibrillation: Secondary | ICD-10-CM

## 2021-06-17 DIAGNOSIS — I10 Essential (primary) hypertension: Secondary | ICD-10-CM

## 2021-06-17 LAB — PROTIME INR (PT): INR: 1.4 — ABNORMAL LOW (ref 2.0–3.0)

## 2021-06-24 ENCOUNTER — Encounter: Admit: 2021-06-24 | Discharge: 2021-06-24 | Payer: MEDICARE

## 2021-06-24 DIAGNOSIS — I10 Essential (primary) hypertension: Secondary | ICD-10-CM

## 2021-06-24 DIAGNOSIS — I7771 Dissection of carotid artery: Secondary | ICD-10-CM

## 2021-06-24 DIAGNOSIS — I4891 Unspecified atrial fibrillation: Secondary | ICD-10-CM

## 2021-06-24 DIAGNOSIS — R011 Cardiac murmur, unspecified: Secondary | ICD-10-CM

## 2021-06-24 DIAGNOSIS — I350 Nonrheumatic aortic (valve) stenosis: Secondary | ICD-10-CM

## 2021-06-24 LAB — PROTIME INR (PT): INR: 1.5 — ABNORMAL LOW

## 2021-06-27 ENCOUNTER — Encounter: Admit: 2021-06-27 | Discharge: 2021-06-27 | Payer: MEDICARE

## 2021-06-27 DIAGNOSIS — I4891 Unspecified atrial fibrillation: Secondary | ICD-10-CM

## 2021-07-01 ENCOUNTER — Encounter: Admit: 2021-07-01 | Discharge: 2021-07-01 | Payer: MEDICARE

## 2021-07-01 DIAGNOSIS — R011 Cardiac murmur, unspecified: Secondary | ICD-10-CM

## 2021-07-01 DIAGNOSIS — I4891 Unspecified atrial fibrillation: Secondary | ICD-10-CM

## 2021-07-01 DIAGNOSIS — I350 Nonrheumatic aortic (valve) stenosis: Secondary | ICD-10-CM

## 2021-07-01 DIAGNOSIS — I7771 Dissection of carotid artery: Secondary | ICD-10-CM

## 2021-07-01 DIAGNOSIS — I10 Essential (primary) hypertension: Secondary | ICD-10-CM

## 2021-07-01 LAB — PROTIME INR (PT): INR: 2.5

## 2021-07-08 ENCOUNTER — Encounter: Admit: 2021-07-08 | Discharge: 2021-07-08 | Payer: MEDICARE

## 2021-07-08 DIAGNOSIS — I7771 Dissection of carotid artery: Secondary | ICD-10-CM

## 2021-07-08 DIAGNOSIS — R011 Cardiac murmur, unspecified: Secondary | ICD-10-CM

## 2021-07-08 DIAGNOSIS — I350 Nonrheumatic aortic (valve) stenosis: Secondary | ICD-10-CM

## 2021-07-08 DIAGNOSIS — I10 Essential (primary) hypertension: Secondary | ICD-10-CM

## 2021-07-08 DIAGNOSIS — I4891 Unspecified atrial fibrillation: Secondary | ICD-10-CM

## 2021-07-08 LAB — PROTIME INR (PT): INR: 2.4

## 2021-07-10 ENCOUNTER — Encounter: Admit: 2021-07-10 | Discharge: 2021-07-10 | Payer: MEDICARE

## 2021-07-10 DIAGNOSIS — I10 Essential (primary) hypertension: Secondary | ICD-10-CM

## 2021-07-10 DIAGNOSIS — E785 Hyperlipidemia, unspecified: Secondary | ICD-10-CM

## 2021-07-10 DIAGNOSIS — I4891 Unspecified atrial fibrillation: Secondary | ICD-10-CM

## 2021-07-10 MED ORDER — ATORVASTATIN 80 MG PO TAB
ORAL_TABLET | Freq: Every day | 3 refills | Status: AC
Start: 2021-07-10 — End: ?

## 2021-07-15 ENCOUNTER — Encounter: Admit: 2021-07-15 | Discharge: 2021-07-15 | Payer: MEDICARE

## 2021-07-15 DIAGNOSIS — I7771 Dissection of carotid artery: Secondary | ICD-10-CM

## 2021-07-15 DIAGNOSIS — R011 Cardiac murmur, unspecified: Secondary | ICD-10-CM

## 2021-07-15 DIAGNOSIS — I4891 Unspecified atrial fibrillation: Secondary | ICD-10-CM

## 2021-07-15 DIAGNOSIS — I350 Nonrheumatic aortic (valve) stenosis: Secondary | ICD-10-CM

## 2021-07-15 DIAGNOSIS — I10 Essential (primary) hypertension: Secondary | ICD-10-CM

## 2021-07-15 LAB — PROTIME INR (PT): INR: 2.1

## 2021-07-22 ENCOUNTER — Encounter: Admit: 2021-07-22 | Discharge: 2021-07-22 | Payer: MEDICARE

## 2021-07-22 DIAGNOSIS — I350 Nonrheumatic aortic (valve) stenosis: Secondary | ICD-10-CM

## 2021-07-22 DIAGNOSIS — I7771 Dissection of carotid artery: Secondary | ICD-10-CM

## 2021-07-22 DIAGNOSIS — I10 Essential (primary) hypertension: Secondary | ICD-10-CM

## 2021-07-22 DIAGNOSIS — I4891 Unspecified atrial fibrillation: Secondary | ICD-10-CM

## 2021-07-22 DIAGNOSIS — R011 Cardiac murmur, unspecified: Secondary | ICD-10-CM

## 2021-07-22 LAB — PROTIME INR (PT): INR: 1.9 — ABNORMAL LOW (ref 2–3)

## 2021-07-23 ENCOUNTER — Encounter: Admit: 2021-07-23 | Discharge: 2021-07-23 | Payer: MEDICARE

## 2021-07-23 DIAGNOSIS — E785 Hyperlipidemia, unspecified: Secondary | ICD-10-CM

## 2021-07-23 DIAGNOSIS — I4891 Unspecified atrial fibrillation: Secondary | ICD-10-CM

## 2021-07-23 DIAGNOSIS — I10 Essential (primary) hypertension: Secondary | ICD-10-CM

## 2021-07-23 LAB — LIPID PROFILE
CHOLESTEROL/HDL %: 3
CHOLESTEROL: 183
HDL: 60
LDL: 107 — ABNORMAL HIGH (ref <100)
TRIGLYCERIDES: 84
VLDL: 17

## 2021-07-23 LAB — LIVER FUNCTION PANEL
ALK PHOSPHATASE: 83
ALT: 29
AST: 29
DIRECT BILIRUBIN: 0.5 — ABNORMAL HIGH (ref 0.00–0.50)
TOTAL BILIRUBIN: 1.5 — ABNORMAL HIGH (ref 0.2–1.2)
TOTAL PROTEIN: 6.6

## 2021-07-23 MED ORDER — EZETIMIBE 10 MG PO TAB
10 mg | ORAL_TABLET | Freq: Every day | ORAL | 1 refills | Status: AC
Start: 2021-07-23 — End: ?

## 2021-07-23 NOTE — Telephone Encounter
From Dr. Imagene Gurney last office visit notes:  1. Dyslipidemia:  He tells me he had a recent lipid panel.  We are going to work to obtain results.  LDL goal is less than 70.  We will add ezetimibe if he is not at goal.    Discussed results and recommendations with patient.  He is agreeable to plan.  New script sent to pharmacy.

## 2021-07-29 ENCOUNTER — Encounter: Admit: 2021-07-29 | Discharge: 2021-07-29 | Payer: MEDICARE

## 2021-07-29 DIAGNOSIS — I4891 Unspecified atrial fibrillation: Secondary | ICD-10-CM

## 2021-07-29 DIAGNOSIS — I10 Essential (primary) hypertension: Secondary | ICD-10-CM

## 2021-07-29 DIAGNOSIS — I7771 Dissection of carotid artery: Secondary | ICD-10-CM

## 2021-07-29 DIAGNOSIS — I350 Nonrheumatic aortic (valve) stenosis: Secondary | ICD-10-CM

## 2021-07-29 DIAGNOSIS — R011 Cardiac murmur, unspecified: Secondary | ICD-10-CM

## 2021-07-29 LAB — PROTIME INR (PT): INR: 2.1

## 2021-08-05 ENCOUNTER — Encounter: Admit: 2021-08-05 | Discharge: 2021-08-05 | Payer: MEDICARE

## 2021-08-05 DIAGNOSIS — R011 Cardiac murmur, unspecified: Secondary | ICD-10-CM

## 2021-08-05 DIAGNOSIS — I10 Essential (primary) hypertension: Secondary | ICD-10-CM

## 2021-08-05 DIAGNOSIS — I350 Nonrheumatic aortic (valve) stenosis: Secondary | ICD-10-CM

## 2021-08-05 DIAGNOSIS — I4891 Unspecified atrial fibrillation: Secondary | ICD-10-CM

## 2021-08-05 DIAGNOSIS — I7771 Dissection of carotid artery: Secondary | ICD-10-CM

## 2021-08-12 ENCOUNTER — Encounter: Admit: 2021-08-12 | Discharge: 2021-08-12 | Payer: MEDICARE

## 2021-08-12 DIAGNOSIS — I4891 Unspecified atrial fibrillation: Secondary | ICD-10-CM

## 2021-08-12 DIAGNOSIS — I350 Nonrheumatic aortic (valve) stenosis: Secondary | ICD-10-CM

## 2021-08-12 DIAGNOSIS — I7771 Dissection of carotid artery: Secondary | ICD-10-CM

## 2021-08-12 DIAGNOSIS — I10 Essential (primary) hypertension: Secondary | ICD-10-CM

## 2021-08-12 DIAGNOSIS — R011 Cardiac murmur, unspecified: Secondary | ICD-10-CM

## 2021-08-12 LAB — PROTIME INR (PT): INR: 3.5 mL/min — ABNORMAL HIGH (ref 2–3)

## 2021-08-19 ENCOUNTER — Encounter: Admit: 2021-08-19 | Discharge: 2021-08-19 | Payer: MEDICARE

## 2021-08-19 DIAGNOSIS — I7771 Dissection of carotid artery: Secondary | ICD-10-CM

## 2021-08-19 DIAGNOSIS — R011 Cardiac murmur, unspecified: Secondary | ICD-10-CM

## 2021-08-19 DIAGNOSIS — I10 Essential (primary) hypertension: Secondary | ICD-10-CM

## 2021-08-19 DIAGNOSIS — I4891 Unspecified atrial fibrillation: Secondary | ICD-10-CM

## 2021-08-19 DIAGNOSIS — I350 Nonrheumatic aortic (valve) stenosis: Secondary | ICD-10-CM

## 2021-08-19 LAB — PROTIME INR (PT): INR: 2.3

## 2021-08-26 ENCOUNTER — Encounter: Admit: 2021-08-26 | Discharge: 2021-08-26 | Payer: MEDICARE

## 2021-08-26 DIAGNOSIS — I4891 Unspecified atrial fibrillation: Secondary | ICD-10-CM

## 2021-08-26 DIAGNOSIS — I7771 Dissection of carotid artery: Secondary | ICD-10-CM

## 2021-08-26 DIAGNOSIS — R011 Cardiac murmur, unspecified: Secondary | ICD-10-CM

## 2021-08-26 DIAGNOSIS — I10 Essential (primary) hypertension: Secondary | ICD-10-CM

## 2021-08-26 DIAGNOSIS — I350 Nonrheumatic aortic (valve) stenosis: Secondary | ICD-10-CM

## 2021-08-26 LAB — PROTIME INR (PT): INR: 2.3

## 2021-09-02 ENCOUNTER — Encounter: Admit: 2021-09-02 | Discharge: 2021-09-02 | Payer: MEDICARE

## 2021-09-02 DIAGNOSIS — I10 Essential (primary) hypertension: Secondary | ICD-10-CM

## 2021-09-02 DIAGNOSIS — R011 Cardiac murmur, unspecified: Secondary | ICD-10-CM

## 2021-09-02 DIAGNOSIS — I350 Nonrheumatic aortic (valve) stenosis: Secondary | ICD-10-CM

## 2021-09-02 DIAGNOSIS — I4891 Unspecified atrial fibrillation: Secondary | ICD-10-CM

## 2021-09-02 DIAGNOSIS — I7771 Dissection of carotid artery: Secondary | ICD-10-CM

## 2021-09-02 LAB — PROTIME INR (PT): INR: 2.6

## 2021-09-09 ENCOUNTER — Encounter: Admit: 2021-09-09 | Discharge: 2021-09-09 | Payer: MEDICARE

## 2021-09-09 DIAGNOSIS — I7771 Dissection of carotid artery: Secondary | ICD-10-CM

## 2021-09-09 DIAGNOSIS — I350 Nonrheumatic aortic (valve) stenosis: Secondary | ICD-10-CM

## 2021-09-09 DIAGNOSIS — I4891 Unspecified atrial fibrillation: Secondary | ICD-10-CM

## 2021-09-09 DIAGNOSIS — R011 Cardiac murmur, unspecified: Secondary | ICD-10-CM

## 2021-09-09 DIAGNOSIS — I10 Essential (primary) hypertension: Secondary | ICD-10-CM

## 2021-09-09 LAB — PROTIME INR (PT): INR: 2.4

## 2021-09-09 NOTE — Progress Notes
09/09/2021 9:55 AM   Patient gets INR every week on home monitor and has requested we do not call unless we make a dose change.  INR is therapeutic and do not plan to change dosing at this time.  Will plan to see another INR in a week.

## 2021-09-16 ENCOUNTER — Encounter: Admit: 2021-09-16 | Discharge: 2021-09-16 | Payer: MEDICARE

## 2021-09-16 DIAGNOSIS — I10 Essential (primary) hypertension: Secondary | ICD-10-CM

## 2021-09-16 DIAGNOSIS — R011 Cardiac murmur, unspecified: Secondary | ICD-10-CM

## 2021-09-16 DIAGNOSIS — I7771 Dissection of carotid artery: Secondary | ICD-10-CM

## 2021-09-16 DIAGNOSIS — I4891 Unspecified atrial fibrillation: Secondary | ICD-10-CM

## 2021-09-16 DIAGNOSIS — I350 Nonrheumatic aortic (valve) stenosis: Secondary | ICD-10-CM

## 2021-09-16 LAB — PROTIME INR (PT): INR: 2.6

## 2021-09-23 ENCOUNTER — Encounter: Admit: 2021-09-23 | Discharge: 2021-09-23 | Payer: MEDICARE

## 2021-09-23 DIAGNOSIS — R011 Cardiac murmur, unspecified: Secondary | ICD-10-CM

## 2021-09-23 DIAGNOSIS — I350 Nonrheumatic aortic (valve) stenosis: Secondary | ICD-10-CM

## 2021-09-23 DIAGNOSIS — I10 Essential (primary) hypertension: Secondary | ICD-10-CM

## 2021-09-23 DIAGNOSIS — I4891 Unspecified atrial fibrillation: Secondary | ICD-10-CM

## 2021-09-23 DIAGNOSIS — I7771 Dissection of carotid artery: Secondary | ICD-10-CM

## 2021-09-23 LAB — PROTIME INR (PT): INR: 2.8 mg/dL (ref 6.0–8.0)

## 2021-09-30 ENCOUNTER — Encounter: Admit: 2021-09-30 | Discharge: 2021-09-30 | Payer: MEDICARE

## 2021-09-30 DIAGNOSIS — I4891 Unspecified atrial fibrillation: Secondary | ICD-10-CM

## 2021-09-30 DIAGNOSIS — I7771 Dissection of carotid artery: Secondary | ICD-10-CM

## 2021-09-30 DIAGNOSIS — I10 Essential (primary) hypertension: Secondary | ICD-10-CM

## 2021-09-30 DIAGNOSIS — I350 Nonrheumatic aortic (valve) stenosis: Secondary | ICD-10-CM

## 2021-09-30 DIAGNOSIS — R011 Cardiac murmur, unspecified: Secondary | ICD-10-CM

## 2021-09-30 LAB — PROTIME INR (PT): INR: 2.3

## 2021-10-07 ENCOUNTER — Encounter: Admit: 2021-10-07 | Discharge: 2021-10-07 | Payer: MEDICARE

## 2021-10-07 DIAGNOSIS — R011 Cardiac murmur, unspecified: Secondary | ICD-10-CM

## 2021-10-07 DIAGNOSIS — I7771 Dissection of carotid artery: Secondary | ICD-10-CM

## 2021-10-07 DIAGNOSIS — I350 Nonrheumatic aortic (valve) stenosis: Secondary | ICD-10-CM

## 2021-10-07 DIAGNOSIS — I4891 Unspecified atrial fibrillation: Secondary | ICD-10-CM

## 2021-10-07 DIAGNOSIS — I10 Essential (primary) hypertension: Secondary | ICD-10-CM

## 2021-10-07 LAB — PROTIME INR (PT): INR: 2.5

## 2021-10-14 ENCOUNTER — Encounter: Admit: 2021-10-14 | Discharge: 2021-10-14 | Payer: MEDICARE

## 2021-10-14 DIAGNOSIS — I4891 Unspecified atrial fibrillation: Secondary | ICD-10-CM

## 2021-10-14 DIAGNOSIS — I10 Essential (primary) hypertension: Secondary | ICD-10-CM

## 2021-10-14 DIAGNOSIS — I350 Nonrheumatic aortic (valve) stenosis: Secondary | ICD-10-CM

## 2021-10-14 DIAGNOSIS — R011 Cardiac murmur, unspecified: Secondary | ICD-10-CM

## 2021-10-14 DIAGNOSIS — I7771 Dissection of carotid artery: Secondary | ICD-10-CM

## 2021-10-14 LAB — PROTIME INR (PT): INR: 2.8

## 2021-10-20 ENCOUNTER — Encounter: Admit: 2021-10-20 | Discharge: 2021-10-20 | Payer: MEDICARE

## 2021-10-20 MED ORDER — WARFARIN 5 MG PO TAB
ORAL_TABLET | ORAL | 3 refills | 90.00000 days | Status: AC
Start: 2021-10-20 — End: ?

## 2021-10-21 ENCOUNTER — Encounter: Admit: 2021-10-21 | Discharge: 2021-10-21 | Payer: MEDICARE

## 2021-10-21 DIAGNOSIS — I7771 Dissection of carotid artery: Secondary | ICD-10-CM

## 2021-10-21 DIAGNOSIS — I350 Nonrheumatic aortic (valve) stenosis: Secondary | ICD-10-CM

## 2021-10-21 DIAGNOSIS — I10 Essential (primary) hypertension: Secondary | ICD-10-CM

## 2021-10-21 DIAGNOSIS — R011 Cardiac murmur, unspecified: Secondary | ICD-10-CM

## 2021-10-21 DIAGNOSIS — I4891 Unspecified atrial fibrillation: Secondary | ICD-10-CM

## 2021-10-21 LAB — PROTIME INR (PT): INR: 2.2

## 2021-10-28 ENCOUNTER — Encounter: Admit: 2021-10-28 | Discharge: 2021-10-28 | Payer: MEDICARE

## 2021-10-28 DIAGNOSIS — I350 Nonrheumatic aortic (valve) stenosis: Secondary | ICD-10-CM

## 2021-10-28 DIAGNOSIS — R011 Cardiac murmur, unspecified: Secondary | ICD-10-CM

## 2021-10-28 DIAGNOSIS — I10 Essential (primary) hypertension: Secondary | ICD-10-CM

## 2021-10-28 DIAGNOSIS — I7771 Dissection of carotid artery: Secondary | ICD-10-CM

## 2021-10-28 DIAGNOSIS — I4891 Unspecified atrial fibrillation: Secondary | ICD-10-CM

## 2021-10-28 LAB — PROTIME INR (PT): INR: 3.2 — ABNORMAL HIGH

## 2021-11-04 ENCOUNTER — Encounter: Admit: 2021-11-04 | Discharge: 2021-11-04 | Payer: MEDICARE

## 2021-11-04 DIAGNOSIS — R011 Cardiac murmur, unspecified: Secondary | ICD-10-CM

## 2021-11-04 DIAGNOSIS — I4891 Unspecified atrial fibrillation: Secondary | ICD-10-CM

## 2021-11-04 DIAGNOSIS — I7771 Dissection of carotid artery: Secondary | ICD-10-CM

## 2021-11-04 DIAGNOSIS — I350 Nonrheumatic aortic (valve) stenosis: Secondary | ICD-10-CM

## 2021-11-04 DIAGNOSIS — I10 Essential (primary) hypertension: Secondary | ICD-10-CM

## 2021-11-04 LAB — PROTIME INR (PT): INR: 3.5 — ABNORMAL HIGH

## 2021-11-11 ENCOUNTER — Encounter: Admit: 2021-11-11 | Discharge: 2021-11-11 | Payer: MEDICARE

## 2021-11-11 DIAGNOSIS — R011 Cardiac murmur, unspecified: Secondary | ICD-10-CM

## 2021-11-11 DIAGNOSIS — I10 Essential (primary) hypertension: Secondary | ICD-10-CM

## 2021-11-11 DIAGNOSIS — I7771 Dissection of carotid artery: Secondary | ICD-10-CM

## 2021-11-11 DIAGNOSIS — I4891 Unspecified atrial fibrillation: Secondary | ICD-10-CM

## 2021-11-11 DIAGNOSIS — I350 Nonrheumatic aortic (valve) stenosis: Secondary | ICD-10-CM

## 2021-11-11 LAB — PROTIME INR (PT): INR: 2.2

## 2021-11-18 ENCOUNTER — Encounter: Admit: 2021-11-18 | Discharge: 2021-11-18 | Payer: MEDICARE

## 2021-11-18 DIAGNOSIS — R011 Cardiac murmur, unspecified: Secondary | ICD-10-CM

## 2021-11-18 DIAGNOSIS — I7771 Dissection of carotid artery: Secondary | ICD-10-CM

## 2021-11-18 DIAGNOSIS — I4891 Unspecified atrial fibrillation: Secondary | ICD-10-CM

## 2021-11-18 DIAGNOSIS — I10 Essential (primary) hypertension: Secondary | ICD-10-CM

## 2021-11-18 DIAGNOSIS — I350 Nonrheumatic aortic (valve) stenosis: Secondary | ICD-10-CM

## 2021-11-18 LAB — PROTIME INR (PT): INR: 3

## 2021-11-25 ENCOUNTER — Encounter: Admit: 2021-11-25 | Discharge: 2021-11-25 | Payer: MEDICARE

## 2021-11-25 DIAGNOSIS — I4891 Unspecified atrial fibrillation: Secondary | ICD-10-CM

## 2021-11-25 DIAGNOSIS — R011 Cardiac murmur, unspecified: Secondary | ICD-10-CM

## 2021-11-25 DIAGNOSIS — I10 Essential (primary) hypertension: Secondary | ICD-10-CM

## 2021-11-25 DIAGNOSIS — I7771 Dissection of carotid artery: Secondary | ICD-10-CM

## 2021-11-25 DIAGNOSIS — I350 Nonrheumatic aortic (valve) stenosis: Secondary | ICD-10-CM

## 2021-11-25 LAB — PROTIME INR (PT): INR: 2.6

## 2021-12-02 ENCOUNTER — Encounter: Admit: 2021-12-02 | Discharge: 2021-12-02 | Payer: MEDICARE

## 2021-12-02 DIAGNOSIS — I4891 Unspecified atrial fibrillation: Secondary | ICD-10-CM

## 2021-12-02 DIAGNOSIS — I7771 Dissection of carotid artery: Secondary | ICD-10-CM

## 2021-12-02 DIAGNOSIS — I350 Nonrheumatic aortic (valve) stenosis: Secondary | ICD-10-CM

## 2021-12-02 DIAGNOSIS — I10 Essential (primary) hypertension: Secondary | ICD-10-CM

## 2021-12-02 DIAGNOSIS — R011 Cardiac murmur, unspecified: Secondary | ICD-10-CM

## 2021-12-02 LAB — PROTIME INR (PT): INR: 2.1

## 2021-12-09 ENCOUNTER — Encounter: Admit: 2021-12-09 | Discharge: 2021-12-09 | Payer: MEDICARE

## 2021-12-09 DIAGNOSIS — R011 Cardiac murmur, unspecified: Secondary | ICD-10-CM

## 2021-12-09 DIAGNOSIS — I350 Nonrheumatic aortic (valve) stenosis: Secondary | ICD-10-CM

## 2021-12-09 DIAGNOSIS — I10 Essential (primary) hypertension: Secondary | ICD-10-CM

## 2021-12-09 DIAGNOSIS — I4891 Unspecified atrial fibrillation: Secondary | ICD-10-CM

## 2021-12-09 DIAGNOSIS — I7771 Dissection of carotid artery: Secondary | ICD-10-CM

## 2021-12-09 LAB — PROTIME INR (PT): INR: 2.1

## 2021-12-17 ENCOUNTER — Encounter: Admit: 2021-12-17 | Discharge: 2021-12-17 | Payer: MEDICARE

## 2021-12-17 DIAGNOSIS — I7771 Dissection of carotid artery: Secondary | ICD-10-CM

## 2021-12-17 DIAGNOSIS — R011 Cardiac murmur, unspecified: Secondary | ICD-10-CM

## 2021-12-17 DIAGNOSIS — I350 Nonrheumatic aortic (valve) stenosis: Secondary | ICD-10-CM

## 2021-12-17 DIAGNOSIS — I4891 Unspecified atrial fibrillation: Secondary | ICD-10-CM

## 2021-12-17 DIAGNOSIS — I10 Essential (primary) hypertension: Secondary | ICD-10-CM

## 2021-12-23 ENCOUNTER — Encounter: Admit: 2021-12-23 | Discharge: 2021-12-23 | Payer: MEDICARE

## 2021-12-23 DIAGNOSIS — I7771 Dissection of carotid artery: Secondary | ICD-10-CM

## 2021-12-23 DIAGNOSIS — I350 Nonrheumatic aortic (valve) stenosis: Secondary | ICD-10-CM

## 2021-12-23 DIAGNOSIS — I10 Essential (primary) hypertension: Secondary | ICD-10-CM

## 2021-12-23 DIAGNOSIS — R011 Cardiac murmur, unspecified: Secondary | ICD-10-CM

## 2021-12-23 DIAGNOSIS — I4891 Unspecified atrial fibrillation: Secondary | ICD-10-CM

## 2021-12-30 ENCOUNTER — Encounter: Admit: 2021-12-30 | Discharge: 2021-12-30 | Payer: MEDICARE

## 2021-12-30 DIAGNOSIS — I4891 Unspecified atrial fibrillation: Secondary | ICD-10-CM

## 2021-12-30 DIAGNOSIS — R011 Cardiac murmur, unspecified: Secondary | ICD-10-CM

## 2021-12-30 DIAGNOSIS — I7771 Dissection of carotid artery: Secondary | ICD-10-CM

## 2021-12-30 DIAGNOSIS — I10 Essential (primary) hypertension: Secondary | ICD-10-CM

## 2021-12-30 DIAGNOSIS — I350 Nonrheumatic aortic (valve) stenosis: Secondary | ICD-10-CM

## 2021-12-30 LAB — PROTIME INR (PT): INR: 3.5 — ABNORMAL HIGH

## 2022-01-06 ENCOUNTER — Encounter: Admit: 2022-01-06 | Discharge: 2022-01-06 | Payer: MEDICARE

## 2022-01-06 DIAGNOSIS — R011 Cardiac murmur, unspecified: Secondary | ICD-10-CM

## 2022-01-06 DIAGNOSIS — I350 Nonrheumatic aortic (valve) stenosis: Secondary | ICD-10-CM

## 2022-01-06 DIAGNOSIS — I4891 Unspecified atrial fibrillation: Secondary | ICD-10-CM

## 2022-01-06 DIAGNOSIS — I7771 Dissection of carotid artery: Secondary | ICD-10-CM

## 2022-01-06 DIAGNOSIS — I10 Essential (primary) hypertension: Secondary | ICD-10-CM

## 2022-01-06 LAB — PROTIME INR (PT): INR: 3.6 — ABNORMAL HIGH (ref 2–3)

## 2022-01-08 ENCOUNTER — Encounter: Admit: 2022-01-08 | Discharge: 2022-01-08 | Payer: MEDICARE

## 2022-01-08 MED ORDER — EZETIMIBE 10 MG PO TAB
ORAL_TABLET | 3 refills | Status: AC
Start: 2022-01-08 — End: ?

## 2022-01-13 ENCOUNTER — Encounter: Admit: 2022-01-13 | Discharge: 2022-01-13 | Payer: MEDICARE

## 2022-01-13 DIAGNOSIS — I7771 Dissection of carotid artery: Secondary | ICD-10-CM

## 2022-01-13 DIAGNOSIS — I4891 Unspecified atrial fibrillation: Secondary | ICD-10-CM

## 2022-01-13 DIAGNOSIS — I10 Essential (primary) hypertension: Secondary | ICD-10-CM

## 2022-01-13 DIAGNOSIS — R011 Cardiac murmur, unspecified: Secondary | ICD-10-CM

## 2022-01-13 DIAGNOSIS — I350 Nonrheumatic aortic (valve) stenosis: Secondary | ICD-10-CM

## 2022-01-13 LAB — PROTIME INR (PT): INR: 2

## 2022-01-20 ENCOUNTER — Encounter: Admit: 2022-01-20 | Discharge: 2022-01-20 | Payer: MEDICARE

## 2022-01-20 DIAGNOSIS — I10 Essential (primary) hypertension: Secondary | ICD-10-CM

## 2022-01-20 DIAGNOSIS — I4891 Unspecified atrial fibrillation: Secondary | ICD-10-CM

## 2022-01-20 DIAGNOSIS — R011 Cardiac murmur, unspecified: Secondary | ICD-10-CM

## 2022-01-20 DIAGNOSIS — I7771 Dissection of carotid artery: Secondary | ICD-10-CM

## 2022-01-20 DIAGNOSIS — I350 Nonrheumatic aortic (valve) stenosis: Secondary | ICD-10-CM

## 2022-01-20 LAB — PROTIME INR (PT): INR: 3.6

## 2022-01-27 ENCOUNTER — Encounter: Admit: 2022-01-27 | Discharge: 2022-01-27 | Payer: MEDICARE

## 2022-01-27 DIAGNOSIS — I4891 Unspecified atrial fibrillation: Secondary | ICD-10-CM

## 2022-01-27 DIAGNOSIS — I10 Essential (primary) hypertension: Secondary | ICD-10-CM

## 2022-01-27 DIAGNOSIS — R011 Cardiac murmur, unspecified: Secondary | ICD-10-CM

## 2022-01-27 DIAGNOSIS — I350 Nonrheumatic aortic (valve) stenosis: Secondary | ICD-10-CM

## 2022-01-27 DIAGNOSIS — I7771 Dissection of carotid artery: Secondary | ICD-10-CM

## 2022-01-27 LAB — PROTIME INR (PT): INR: 2.8

## 2022-01-30 ENCOUNTER — Encounter: Admit: 2022-01-30 | Discharge: 2022-01-30 | Payer: MEDICARE

## 2022-01-30 MED ORDER — METOPROLOL TARTRATE 25 MG PO TAB
ORAL_TABLET | ORAL | 3 refills | 90.00000 days | Status: AC
Start: 2022-01-30 — End: ?

## 2022-02-03 ENCOUNTER — Encounter: Admit: 2022-02-03 | Discharge: 2022-02-03 | Payer: MEDICARE

## 2022-02-03 DIAGNOSIS — I350 Nonrheumatic aortic (valve) stenosis: Secondary | ICD-10-CM

## 2022-02-03 DIAGNOSIS — R011 Cardiac murmur, unspecified: Secondary | ICD-10-CM

## 2022-02-03 DIAGNOSIS — I4891 Unspecified atrial fibrillation: Secondary | ICD-10-CM

## 2022-02-03 DIAGNOSIS — I7771 Dissection of carotid artery: Secondary | ICD-10-CM

## 2022-02-03 DIAGNOSIS — I10 Essential (primary) hypertension: Secondary | ICD-10-CM

## 2022-02-03 LAB — PROTIME INR (PT): INR: 1.8

## 2022-02-10 ENCOUNTER — Encounter: Admit: 2022-02-10 | Discharge: 2022-02-10 | Payer: MEDICARE

## 2022-02-10 DIAGNOSIS — R011 Cardiac murmur, unspecified: Secondary | ICD-10-CM

## 2022-02-10 DIAGNOSIS — I10 Essential (primary) hypertension: Secondary | ICD-10-CM

## 2022-02-10 DIAGNOSIS — I4891 Unspecified atrial fibrillation: Secondary | ICD-10-CM

## 2022-02-10 DIAGNOSIS — I7771 Dissection of carotid artery: Secondary | ICD-10-CM

## 2022-02-10 DIAGNOSIS — I350 Nonrheumatic aortic (valve) stenosis: Secondary | ICD-10-CM

## 2022-02-10 LAB — PROTIME INR (PT): INR: 2.3 g/dL (ref 3.5–5.0)

## 2022-02-13 ENCOUNTER — Encounter: Admit: 2022-02-13 | Discharge: 2022-02-13 | Payer: MEDICARE

## 2022-02-13 DIAGNOSIS — I350 Nonrheumatic aortic (valve) stenosis: Secondary | ICD-10-CM

## 2022-02-13 DIAGNOSIS — I4891 Unspecified atrial fibrillation: Secondary | ICD-10-CM

## 2022-02-13 DIAGNOSIS — R011 Cardiac murmur, unspecified: Secondary | ICD-10-CM

## 2022-02-13 DIAGNOSIS — I10 Essential (primary) hypertension: Secondary | ICD-10-CM

## 2022-02-13 DIAGNOSIS — I7771 Dissection of carotid artery: Secondary | ICD-10-CM

## 2022-02-17 ENCOUNTER — Encounter: Admit: 2022-02-17 | Discharge: 2022-02-17 | Payer: MEDICARE

## 2022-02-17 DIAGNOSIS — I10 Essential (primary) hypertension: Secondary | ICD-10-CM

## 2022-02-17 DIAGNOSIS — I4891 Unspecified atrial fibrillation: Secondary | ICD-10-CM

## 2022-02-17 DIAGNOSIS — I7771 Dissection of carotid artery: Secondary | ICD-10-CM

## 2022-02-17 DIAGNOSIS — R011 Cardiac murmur, unspecified: Secondary | ICD-10-CM

## 2022-02-17 DIAGNOSIS — I350 Nonrheumatic aortic (valve) stenosis: Secondary | ICD-10-CM

## 2022-02-17 LAB — PROTIME INR (PT): INR: 2.3

## 2022-02-24 ENCOUNTER — Encounter: Admit: 2022-02-24 | Discharge: 2022-02-24 | Payer: MEDICARE

## 2022-02-24 DIAGNOSIS — I350 Nonrheumatic aortic (valve) stenosis: Secondary | ICD-10-CM

## 2022-02-24 DIAGNOSIS — I4891 Unspecified atrial fibrillation: Secondary | ICD-10-CM

## 2022-02-24 DIAGNOSIS — I10 Essential (primary) hypertension: Secondary | ICD-10-CM

## 2022-02-24 DIAGNOSIS — R011 Cardiac murmur, unspecified: Secondary | ICD-10-CM

## 2022-02-24 DIAGNOSIS — I7771 Dissection of carotid artery: Secondary | ICD-10-CM

## 2022-02-24 LAB — PROTIME INR (PT): INR: 2.8 K/UL (ref 0–0.20)

## 2022-03-03 ENCOUNTER — Encounter: Admit: 2022-03-03 | Discharge: 2022-03-03 | Payer: MEDICARE

## 2022-03-03 DIAGNOSIS — I7771 Dissection of carotid artery: Secondary | ICD-10-CM

## 2022-03-03 DIAGNOSIS — R011 Cardiac murmur, unspecified: Secondary | ICD-10-CM

## 2022-03-03 DIAGNOSIS — I350 Nonrheumatic aortic (valve) stenosis: Secondary | ICD-10-CM

## 2022-03-03 DIAGNOSIS — I10 Essential (primary) hypertension: Secondary | ICD-10-CM

## 2022-03-03 DIAGNOSIS — I4891 Unspecified atrial fibrillation: Secondary | ICD-10-CM

## 2022-03-03 LAB — PROTIME INR (PT): INR: 2.8

## 2022-03-10 ENCOUNTER — Encounter: Admit: 2022-03-10 | Discharge: 2022-03-10 | Payer: MEDICARE

## 2022-03-10 DIAGNOSIS — I10 Essential (primary) hypertension: Secondary | ICD-10-CM

## 2022-03-10 DIAGNOSIS — I4891 Unspecified atrial fibrillation: Secondary | ICD-10-CM

## 2022-03-10 DIAGNOSIS — I7771 Dissection of carotid artery: Secondary | ICD-10-CM

## 2022-03-10 DIAGNOSIS — R011 Cardiac murmur, unspecified: Secondary | ICD-10-CM

## 2022-03-10 DIAGNOSIS — I350 Nonrheumatic aortic (valve) stenosis: Secondary | ICD-10-CM

## 2022-03-10 LAB — PROTIME INR (PT): INR: 2.5

## 2022-03-17 ENCOUNTER — Encounter: Admit: 2022-03-17 | Discharge: 2022-03-17 | Payer: MEDICARE

## 2022-03-17 DIAGNOSIS — R011 Cardiac murmur, unspecified: Secondary | ICD-10-CM

## 2022-03-17 DIAGNOSIS — I350 Nonrheumatic aortic (valve) stenosis: Secondary | ICD-10-CM

## 2022-03-17 DIAGNOSIS — I4891 Unspecified atrial fibrillation: Secondary | ICD-10-CM

## 2022-03-17 DIAGNOSIS — I10 Essential (primary) hypertension: Secondary | ICD-10-CM

## 2022-03-17 DIAGNOSIS — I7771 Dissection of carotid artery: Secondary | ICD-10-CM

## 2022-03-24 ENCOUNTER — Encounter: Admit: 2022-03-24 | Discharge: 2022-03-24 | Payer: MEDICARE

## 2022-03-24 DIAGNOSIS — I10 Essential (primary) hypertension: Secondary | ICD-10-CM

## 2022-03-24 DIAGNOSIS — R011 Cardiac murmur, unspecified: Secondary | ICD-10-CM

## 2022-03-24 DIAGNOSIS — I7771 Dissection of carotid artery: Secondary | ICD-10-CM

## 2022-03-24 DIAGNOSIS — I4891 Unspecified atrial fibrillation: Secondary | ICD-10-CM

## 2022-03-24 DIAGNOSIS — I350 Nonrheumatic aortic (valve) stenosis: Secondary | ICD-10-CM

## 2022-03-24 LAB — PROTIME INR (PT): INR: 2.1

## 2022-03-31 ENCOUNTER — Encounter: Admit: 2022-03-31 | Discharge: 2022-03-31 | Payer: MEDICARE

## 2022-03-31 DIAGNOSIS — I4891 Unspecified atrial fibrillation: Secondary | ICD-10-CM

## 2022-03-31 DIAGNOSIS — Z7901 Long term (current) use of anticoagulants: Secondary | ICD-10-CM

## 2022-03-31 LAB — PROTIME INR (PT): INR: 2.5

## 2022-04-07 ENCOUNTER — Encounter: Admit: 2022-04-07 | Discharge: 2022-04-07 | Payer: MEDICARE

## 2022-04-07 DIAGNOSIS — I4891 Unspecified atrial fibrillation: Secondary | ICD-10-CM

## 2022-04-07 DIAGNOSIS — Z7901 Long term (current) use of anticoagulants: Secondary | ICD-10-CM

## 2022-04-07 LAB — PROTIME INR (PT): INR: 2.8

## 2022-04-14 ENCOUNTER — Encounter: Admit: 2022-04-14 | Discharge: 2022-04-14 | Payer: MEDICARE

## 2022-04-14 DIAGNOSIS — I4891 Unspecified atrial fibrillation: Secondary | ICD-10-CM

## 2022-04-14 DIAGNOSIS — Z7901 Long term (current) use of anticoagulants: Secondary | ICD-10-CM

## 2022-04-14 LAB — PROTIME INR (PT): INR: 2.4

## 2022-04-21 ENCOUNTER — Encounter: Admit: 2022-04-21 | Discharge: 2022-04-21 | Payer: MEDICARE

## 2022-04-21 DIAGNOSIS — Z7901 Long term (current) use of anticoagulants: Secondary | ICD-10-CM

## 2022-04-21 DIAGNOSIS — I4891 Unspecified atrial fibrillation: Secondary | ICD-10-CM

## 2022-04-21 LAB — PROTIME INR (PT): INR: 2.7

## 2022-04-28 ENCOUNTER — Encounter: Admit: 2022-04-28 | Discharge: 2022-04-28 | Payer: MEDICARE

## 2022-04-28 DIAGNOSIS — I4891 Unspecified atrial fibrillation: Secondary | ICD-10-CM

## 2022-04-28 DIAGNOSIS — Z7901 Long term (current) use of anticoagulants: Secondary | ICD-10-CM

## 2022-04-28 LAB — PROTIME INR (PT): INR: 2.8

## 2022-05-05 ENCOUNTER — Encounter: Admit: 2022-05-05 | Discharge: 2022-05-05 | Payer: MEDICARE

## 2022-05-05 DIAGNOSIS — Z7901 Long term (current) use of anticoagulants: Secondary | ICD-10-CM

## 2022-05-05 DIAGNOSIS — I4891 Unspecified atrial fibrillation: Secondary | ICD-10-CM

## 2022-05-05 LAB — PROTIME INR (PT): INR: 2.9

## 2022-05-12 ENCOUNTER — Encounter: Admit: 2022-05-12 | Discharge: 2022-05-12 | Payer: MEDICARE

## 2022-05-12 DIAGNOSIS — Z7901 Long term (current) use of anticoagulants: Secondary | ICD-10-CM

## 2022-05-12 DIAGNOSIS — I4891 Unspecified atrial fibrillation: Secondary | ICD-10-CM

## 2022-05-12 LAB — PROTIME INR (PT): INR: 3.1 — ABNORMAL HIGH (ref 2–3)

## 2022-05-19 ENCOUNTER — Encounter: Admit: 2022-05-19 | Discharge: 2022-05-19 | Payer: MEDICARE

## 2022-05-19 DIAGNOSIS — I4891 Unspecified atrial fibrillation: Secondary | ICD-10-CM

## 2022-05-19 DIAGNOSIS — Z7901 Long term (current) use of anticoagulants: Secondary | ICD-10-CM

## 2022-05-19 LAB — PROTIME INR (PT): INR: 1.7 mg/dL — ABNORMAL LOW

## 2022-05-26 ENCOUNTER — Encounter: Admit: 2022-05-26 | Discharge: 2022-05-26 | Payer: MEDICARE

## 2022-05-26 DIAGNOSIS — Z7901 Long term (current) use of anticoagulants: Secondary | ICD-10-CM

## 2022-05-26 DIAGNOSIS — I4891 Unspecified atrial fibrillation: Secondary | ICD-10-CM

## 2022-05-26 LAB — PROTIME INR (PT): INR: 2.8 mg/dL (ref 0.4–1.00)

## 2022-06-02 ENCOUNTER — Encounter: Admit: 2022-06-02 | Discharge: 2022-06-02 | Payer: MEDICARE

## 2022-06-02 DIAGNOSIS — I4891 Unspecified atrial fibrillation: Secondary | ICD-10-CM

## 2022-06-02 DIAGNOSIS — Z7901 Long term (current) use of anticoagulants: Secondary | ICD-10-CM

## 2022-06-02 LAB — PROTIME INR (PT): INR: 2.9 % (ref 0–2)

## 2022-06-09 ENCOUNTER — Encounter: Admit: 2022-06-09 | Discharge: 2022-06-09 | Payer: MEDICARE

## 2022-06-09 DIAGNOSIS — I4891 Unspecified atrial fibrillation: Secondary | ICD-10-CM

## 2022-06-09 DIAGNOSIS — Z7901 Long term (current) use of anticoagulants: Secondary | ICD-10-CM

## 2022-06-09 LAB — PROTIME INR (PT): INR: 3

## 2022-06-16 ENCOUNTER — Encounter: Admit: 2022-06-16 | Discharge: 2022-06-16 | Payer: MEDICARE

## 2022-06-16 DIAGNOSIS — I4891 Unspecified atrial fibrillation: Secondary | ICD-10-CM

## 2022-06-16 DIAGNOSIS — Z7901 Long term (current) use of anticoagulants: Secondary | ICD-10-CM

## 2022-06-16 LAB — PROTIME INR (PT): INR: 2.6

## 2022-06-23 ENCOUNTER — Encounter: Admit: 2022-06-23 | Discharge: 2022-06-23 | Payer: MEDICARE

## 2022-06-23 DIAGNOSIS — I4891 Unspecified atrial fibrillation: Secondary | ICD-10-CM

## 2022-06-23 DIAGNOSIS — Z7901 Long term (current) use of anticoagulants: Secondary | ICD-10-CM

## 2022-06-23 LAB — PROTIME INR (PT): INR: 3.2 — ABNORMAL HIGH (ref 2–3)

## 2022-06-26 ENCOUNTER — Encounter: Admit: 2022-06-26 | Discharge: 2022-06-26 | Payer: MEDICARE

## 2022-06-26 DIAGNOSIS — I350 Nonrheumatic aortic (valve) stenosis: Secondary | ICD-10-CM

## 2022-06-26 DIAGNOSIS — I358 Other nonrheumatic aortic valve disorders: Secondary | ICD-10-CM

## 2022-06-26 DIAGNOSIS — I272 Pulmonary hypertension, unspecified: Secondary | ICD-10-CM

## 2022-06-26 DIAGNOSIS — I7771 Dissection of carotid artery: Secondary | ICD-10-CM

## 2022-06-26 DIAGNOSIS — I4891 Unspecified atrial fibrillation: Secondary | ICD-10-CM

## 2022-06-26 DIAGNOSIS — I10 Essential (primary) hypertension: Secondary | ICD-10-CM

## 2022-06-26 MED ORDER — ATORVASTATIN 80 MG PO TAB
ORAL_TABLET | 3 refills | Status: AC
Start: 2022-06-26 — End: ?

## 2022-06-26 NOTE — Telephone Encounter
Annual FLP due. [facility] sent. Pt has OV scheduled with [facility] in 07/2022. Will review FLP results at that time. Refill sent per protocol.

## 2022-06-30 ENCOUNTER — Encounter: Admit: 2022-06-30 | Discharge: 2022-06-30 | Payer: MEDICARE

## 2022-06-30 DIAGNOSIS — I4891 Unspecified atrial fibrillation: Secondary | ICD-10-CM

## 2022-06-30 LAB — PROTIME INR (PT): INR: 1.8

## 2022-07-01 ENCOUNTER — Encounter: Admit: 2022-07-01 | Discharge: 2022-07-01 | Payer: MEDICARE

## 2022-07-01 DIAGNOSIS — Z7901 Long term (current) use of anticoagulants: Secondary | ICD-10-CM

## 2022-07-01 DIAGNOSIS — I4891 Unspecified atrial fibrillation: Secondary | ICD-10-CM

## 2022-07-01 LAB — PROTIME INR (PT): INR: 1.8

## 2022-07-07 ENCOUNTER — Encounter: Admit: 2022-07-07 | Discharge: 2022-07-07 | Payer: MEDICARE

## 2022-07-07 DIAGNOSIS — I4891 Unspecified atrial fibrillation: Secondary | ICD-10-CM

## 2022-07-07 DIAGNOSIS — Z7901 Long term (current) use of anticoagulants: Secondary | ICD-10-CM

## 2022-07-07 LAB — PROTIME INR (PT): INR: 2.3

## 2022-07-14 ENCOUNTER — Encounter: Admit: 2022-07-14 | Discharge: 2022-07-14 | Payer: MEDICARE

## 2022-07-14 DIAGNOSIS — I4891 Unspecified atrial fibrillation: Secondary | ICD-10-CM

## 2022-07-14 DIAGNOSIS — Z7901 Long term (current) use of anticoagulants: Secondary | ICD-10-CM

## 2022-07-14 LAB — PROTIME INR (PT): INR: 2.1

## 2022-07-21 ENCOUNTER — Encounter: Admit: 2022-07-21 | Discharge: 2022-07-21 | Payer: MEDICARE

## 2022-07-21 DIAGNOSIS — I4891 Unspecified atrial fibrillation: Secondary | ICD-10-CM

## 2022-07-21 DIAGNOSIS — Z7901 Long term (current) use of anticoagulants: Secondary | ICD-10-CM

## 2022-07-21 LAB — PROTIME INR (PT): INR: 2.4

## 2022-07-28 ENCOUNTER — Encounter: Admit: 2022-07-28 | Discharge: 2022-07-28 | Payer: MEDICARE

## 2022-07-28 DIAGNOSIS — I4891 Unspecified atrial fibrillation: Secondary | ICD-10-CM

## 2022-07-28 DIAGNOSIS — Z7901 Long term (current) use of anticoagulants: Secondary | ICD-10-CM

## 2022-07-28 LAB — PROTIME INR (PT): INR: 2.8 M/UL — ABNORMAL LOW (ref 4.0–5.0)

## 2022-08-04 ENCOUNTER — Encounter: Admit: 2022-08-04 | Discharge: 2022-08-04 | Payer: MEDICARE

## 2022-08-04 DIAGNOSIS — I350 Nonrheumatic aortic (valve) stenosis: Secondary | ICD-10-CM

## 2022-08-04 DIAGNOSIS — I4891 Unspecified atrial fibrillation: Secondary | ICD-10-CM

## 2022-08-04 DIAGNOSIS — I358 Other nonrheumatic aortic valve disorders: Secondary | ICD-10-CM

## 2022-08-04 DIAGNOSIS — I7771 Dissection of carotid artery: Secondary | ICD-10-CM

## 2022-08-04 DIAGNOSIS — Z7901 Long term (current) use of anticoagulants: Secondary | ICD-10-CM

## 2022-08-04 DIAGNOSIS — Z136 Encounter for screening for cardiovascular disorders: Secondary | ICD-10-CM

## 2022-08-04 DIAGNOSIS — I272 Pulmonary hypertension, unspecified: Secondary | ICD-10-CM

## 2022-08-04 DIAGNOSIS — I10 Essential (primary) hypertension: Secondary | ICD-10-CM

## 2022-08-04 DIAGNOSIS — R0989 Other specified symptoms and signs involving the circulatory and respiratory systems: Secondary | ICD-10-CM

## 2022-08-04 DIAGNOSIS — T50905A Adverse effect of unspecified drugs, medicaments and biological substances, initial encounter: Secondary | ICD-10-CM

## 2022-08-04 DIAGNOSIS — E785 Hyperlipidemia, unspecified: Secondary | ICD-10-CM

## 2022-08-04 LAB — LIPID PROFILE
CHOLESTEROL/HDL %: 3
CHOLESTEROL: 162 (ref <20.7)
HDL: 58
LDL: 90
TRIGLYCERIDES: 70
VLDL: 14

## 2022-08-04 LAB — PROTIME INR (PT): INR: 2.7

## 2022-08-04 NOTE — Patient Instructions
Echo and Carotid done in [place] office prior to 1year office visit     Follow up as directed.  Call sooner if issues.  Call the [place] line at [phone].  Leave a detailed message for the nurse in [place] with how we can assist you and we will call you back.

## 2022-08-04 NOTE — Progress Notes
Date of Service: 08/04/2022    Gabriel Davidson is a 82 y.o. male.       Chief Complaint: Follow-up    History of Present Illness:     I had the pleasure seeing Gabriel Davidson in our [place] office this afternoon for cardiovascular followup.      As you know, Gabriel Davidson is a remarkably pleasant 82 year old gentleman with history of hypertension, dyslipidemia, persistent atrial fibrillation, distal right internal carotid artery dissection, abdominal aorta dissection, and moderate tricuspid valve regurgitation.  Gabriel Davidson has been in atrial fibrillation for several years after an attempted cardioversion in 2017 and an esophageal probe could not be passed and left atrial appendage could not be evaluated for thrombus.  He has since been on a rate control strategy and has done well.  He is anticoagulated with warfarin.  His last transthoracic echocardiogram was August 30, 2017, and demonstrated normal left ventricular systolic function.  Ejection fraction 55%.  The right ventricle was mildly dilated with normal systolic function.  There was mild left and severe right atrial enlargement.  Moderate tricuspid valve regurgitation.  The aortic valve was sclerotic without evidence of stenosis or regurgitation.  Estimated pulmonary artery systolic pressure was 31 mmHg.      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  He was evaluated by Gabriel Alto, MD and Gabriel Leather, MD from Interventional Radiology.  It was felt the dissection was best managed with conservative measures.  No intervention was undertaken.      Over the last several years, Gabriel Davidson has really gotten along quite nicely.  He has been pretty stable from a cardiovascular perspective.  He is not having any chest pain or shortness of breath.  No lightheadedness, dizziness, palpitations, orthopnea, paroxysmal nocturnal dyspnea, or lower extremity swelling.      Over the summer, Gabriel [place] went to [place].  He was down there 4 days, 2 of which were paid for by the [place].  He was a little disappointed in the pricing of everything, entirely too expensive, way too cumbersome to manage [name].  He is not interested in going back.        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   HENT: Negative.     Eyes: Negative.    Cardiovascular: Negative.    Respiratory: Negative.     Endocrine: Negative.    Hematologic/Lymphatic: Negative.    Skin: Negative.    Musculoskeletal: Negative.    Gastrointestinal: Negative.    Genitourinary: Negative.    Neurological: Negative.    Psychiatric/Behavioral: Negative.     Allergic/Immunologic: Negative.        Vitals:    08/04/22 0905   BP: (!) 148/92   BP Source: Arm, Left Upper   Pulse: 95   SpO2: 97%   O2 Device: None (Room air)   PainSc: Zero  Weight: 89.8 kg (198 lb)   Height: 177.8 cm (5' 10)     Body mass index is 28.41 kg/m?Marland Kitchen    Physical Examination:  General Appearance: No acute distress. Fully alert and oriented.  Skin: Warm. No ulcers or xanthomas.   HEENT: Grossly unremarkable. Lips and oral mucosa without pallor or cyanosis. Moist mucous membranes.   Neck Veins: Normal jugular venous pressure. Neck veins are not distended.  Carotid Arteries: Normal carotid upstroke bilaterally. No bruits.  Chest Inspection: Chest is normal in appearance.  Auscultation/Percussion: Normal respiratory effort. Lungs clear to auscultation bilaterally. No wheezes, rales, or rhonchi.    Cardiac Rhythm: Regular rhythm. Normal rate.  Cardiac Auscultation: Normal S1 & S2. No S3 or S4. No rub.  Murmurs: No cardiac murmurs.  Peripheral Circulation: Normal peripheral circulation.   Abdominal Aorta: No abdominal aortic bruit.  Extremities: Appropriately warm to touch. No lower extremity edema.  Abdominal Exam: Soft, non-tender. No masses, no organomegaly. Normal bowel sounds.  Neurologic Exam: Neurological assessment grossly intact.       Assessment and Plan:  Persistent atrial fibrillation:  He had cardioversion in 2017.  Since then, he has been on rate control strategy for his atrial fibrillation.  Continue metoprolol to 75 mg daily.  He tells me he is typically around 70 beats per minute at home.  He is on warfarin for anticoagulation.  No symptoms.  Hypertension:  Well controlled.  His blood pressure in the office today is elevated at 148/92 mmHg, but he tells me at home his systolic readings are consistently between 120-130 mmHg. We will go ahead and continue his current regimen for now.  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  Plan to repeat abdominal aortic ultrasound and carotid ultrasound next year.  Dyslipidemia: It has been about a year since his last fasting lipid panel.  LDL goal is less than 70.  He is now on ezetimibe.  We are getting a fasting lipid panel today.  Moderate tricuspid valve regurgitation: Will repeat an echocardiogram in a year.  Last echocardiogram was May 2022.     It was great seeing Gabriel Davidson back in the office today.  I appreciate the opportunity to take part in his care.  Glad to hear he is doing well.  We are not going to make any changes to his regimen today.  Will follow up his outside fasting lipid panel.  I will plan to see him back in about a year.  If I can be of any assistance in the interim, please do not hesitate to reach out with questions or concerns.         Total time spent on today's office visit was 35 minutes. This includes face-to-face in person visit with patient as well as non face-to-face time including review of the electronic medical record, outside records, labs, radiologic studies, cardiovascular studies, formulation of treatment plan, after visit summary, future disposition, personal discussions, and documentation.    Current Medications (including today's revisions)   aspirin EC 81 mg tablet Take one tablet by mouth daily. Take with food.    atorvastatin (LIPITOR) 80 mg tablet TAKE ONE TABLET BY MOUTH EVERY DAY    diphenhydrAMINE (BENADRYL) 25 mg capsule Take one capsule by mouth every 4-6 hours as needed.    ezetimibe (ZETIA) 10 mg tablet TAKE ONE TABLET BY MOUTH EVERY DAY    metoprolol tartrate 25 mg tablet TAKE THREE TABLETS BY MOUTH TWICE DAILY    pantoprazole DR (PROTONIX) 40 mg tablet  Take one tablet by mouth daily.    warfarin (COUMADIN) 5 mg tablet TAKE 1-2 tablets by mouth daily as directed for INR.

## 2022-08-11 ENCOUNTER — Encounter: Admit: 2022-08-11 | Discharge: 2022-08-11 | Payer: MEDICARE

## 2022-08-11 DIAGNOSIS — Z7901 Long term (current) use of anticoagulants: Secondary | ICD-10-CM

## 2022-08-11 DIAGNOSIS — I4891 Unspecified atrial fibrillation: Secondary | ICD-10-CM

## 2022-08-11 LAB — PROTIME INR (PT): INR: 2

## 2022-08-12 ENCOUNTER — Encounter: Admit: 2022-08-12 | Discharge: 2022-08-12 | Payer: MEDICARE

## 2022-08-12 DIAGNOSIS — E785 Hyperlipidemia, unspecified: Secondary | ICD-10-CM

## 2022-08-12 DIAGNOSIS — I10 Essential (primary) hypertension: Secondary | ICD-10-CM

## 2022-08-12 DIAGNOSIS — I7771 Dissection of carotid artery: Secondary | ICD-10-CM

## 2022-08-12 NOTE — Telephone Encounter
Sent mychart message with results.  Placed order for future labs.

## 2022-08-12 NOTE — Telephone Encounter
-----   Message from Benna Dunks, MD sent at 08/11/2022  1:56 PM CST -----  Since he is not known to have coronary artery disease I think we continue his current regimen for now.  Repeat fasting lipid panel in 6 months.  Thank you estimation point  ----- Message -----  From: Baldwin Crown, RN  Sent: 08/04/2022  11:39 AM CST  To: Benna Dunks, MD    Labs for your review, pt seen in clinic today.  LDL 90 pt is on atorvastatin '80mg'$  and zetia 10.  Please let me know if you have any recommendations.  Thank you!

## 2022-08-18 ENCOUNTER — Encounter: Admit: 2022-08-18 | Discharge: 2022-08-18 | Payer: MEDICARE

## 2022-08-18 DIAGNOSIS — Z7901 Long term (current) use of anticoagulants: Secondary | ICD-10-CM

## 2022-08-18 DIAGNOSIS — I4891 Unspecified atrial fibrillation: Secondary | ICD-10-CM

## 2022-08-18 LAB — PROTIME INR (PT): INR: 2.1

## 2022-08-25 ENCOUNTER — Encounter: Admit: 2022-08-25 | Discharge: 2022-08-25 | Payer: MEDICARE

## 2022-08-25 DIAGNOSIS — Z7901 Long term (current) use of anticoagulants: Secondary | ICD-10-CM

## 2022-08-25 DIAGNOSIS — I4891 Unspecified atrial fibrillation: Secondary | ICD-10-CM

## 2022-08-25 LAB — PROTIME INR (PT): INR: 2.4

## 2022-09-02 ENCOUNTER — Encounter: Admit: 2022-09-02 | Discharge: 2022-09-02 | Payer: MEDICARE

## 2022-09-02 DIAGNOSIS — I4891 Unspecified atrial fibrillation: Secondary | ICD-10-CM

## 2022-09-02 DIAGNOSIS — Z7901 Long term (current) use of anticoagulants: Secondary | ICD-10-CM

## 2022-09-08 ENCOUNTER — Encounter: Admit: 2022-09-08 | Discharge: 2022-09-08 | Payer: MEDICARE

## 2022-09-08 DIAGNOSIS — I4891 Unspecified atrial fibrillation: Secondary | ICD-10-CM

## 2022-09-08 DIAGNOSIS — Z7901 Long term (current) use of anticoagulants: Secondary | ICD-10-CM

## 2022-09-08 LAB — PROTIME INR (PT): INR: 4.3 % — ABNORMAL HIGH (ref 24–44)

## 2022-09-15 ENCOUNTER — Encounter: Admit: 2022-09-15 | Discharge: 2022-09-15 | Payer: MEDICARE

## 2022-09-15 DIAGNOSIS — I4891 Unspecified atrial fibrillation: Secondary | ICD-10-CM

## 2022-09-15 DIAGNOSIS — Z7901 Long term (current) use of anticoagulants: Secondary | ICD-10-CM

## 2022-09-15 LAB — PROTIME INR (PT): INR: 2.5

## 2022-09-22 ENCOUNTER — Encounter: Admit: 2022-09-22 | Discharge: 2022-09-22 | Payer: MEDICARE

## 2022-09-22 DIAGNOSIS — Z7901 Long term (current) use of anticoagulants: Secondary | ICD-10-CM

## 2022-09-22 DIAGNOSIS — I4891 Unspecified atrial fibrillation: Secondary | ICD-10-CM

## 2022-09-22 LAB — PROTIME INR (PT): INR: 2.9

## 2022-09-29 ENCOUNTER — Encounter: Admit: 2022-09-29 | Discharge: 2022-09-29 | Payer: MEDICARE

## 2022-09-29 DIAGNOSIS — I4891 Unspecified atrial fibrillation: Secondary | ICD-10-CM

## 2022-09-29 DIAGNOSIS — Z7901 Long term (current) use of anticoagulants: Secondary | ICD-10-CM

## 2022-09-29 LAB — PROTIME INR (PT): INR: 3

## 2022-10-06 ENCOUNTER — Encounter: Admit: 2022-10-06 | Discharge: 2022-10-06 | Payer: MEDICARE

## 2022-10-06 DIAGNOSIS — I4891 Unspecified atrial fibrillation: Secondary | ICD-10-CM

## 2022-10-06 DIAGNOSIS — Z7901 Long term (current) use of anticoagulants: Secondary | ICD-10-CM

## 2022-10-06 LAB — PROTIME INR (PT): INR: 2.4

## 2022-10-13 ENCOUNTER — Encounter: Admit: 2022-10-13 | Discharge: 2022-10-13 | Payer: MEDICARE

## 2022-10-13 DIAGNOSIS — Z7901 Long term (current) use of anticoagulants: Secondary | ICD-10-CM

## 2022-10-13 DIAGNOSIS — I4891 Unspecified atrial fibrillation: Secondary | ICD-10-CM

## 2022-10-13 LAB — PROTIME INR (PT): INR: 3

## 2022-10-20 ENCOUNTER — Encounter: Admit: 2022-10-20 | Discharge: 2022-10-20 | Payer: MEDICARE

## 2022-10-20 DIAGNOSIS — I4891 Unspecified atrial fibrillation: Secondary | ICD-10-CM

## 2022-10-20 DIAGNOSIS — Z7901 Long term (current) use of anticoagulants: Secondary | ICD-10-CM

## 2022-10-20 LAB — PROTIME INR (PT): INR: 2.1

## 2022-10-27 ENCOUNTER — Encounter: Admit: 2022-10-27 | Discharge: 2022-10-27 | Payer: MEDICARE

## 2022-10-27 DIAGNOSIS — I4891 Unspecified atrial fibrillation: Secondary | ICD-10-CM

## 2022-11-03 ENCOUNTER — Encounter: Admit: 2022-11-03 | Discharge: 2022-11-03 | Payer: MEDICARE

## 2022-11-03 DIAGNOSIS — I4891 Unspecified atrial fibrillation: Secondary | ICD-10-CM

## 2022-11-03 DIAGNOSIS — Z7901 Long term (current) use of anticoagulants: Secondary | ICD-10-CM

## 2022-11-03 LAB — PROTIME INR (PT): INR: 2.8

## 2022-11-18 ENCOUNTER — Encounter: Admit: 2022-11-18 | Discharge: 2022-11-18 | Payer: MEDICARE

## 2022-11-18 DIAGNOSIS — I4891 Unspecified atrial fibrillation: Secondary | ICD-10-CM

## 2022-11-18 DIAGNOSIS — Z7901 Long term (current) use of anticoagulants: Secondary | ICD-10-CM

## 2022-11-24 ENCOUNTER — Encounter: Admit: 2022-11-24 | Discharge: 2022-11-24 | Payer: MEDICARE

## 2022-11-24 DIAGNOSIS — Z7901 Long term (current) use of anticoagulants: Secondary | ICD-10-CM

## 2022-11-24 DIAGNOSIS — I4891 Unspecified atrial fibrillation: Secondary | ICD-10-CM

## 2022-11-24 LAB — PROTIME INR (PT): INR: 3.2

## 2022-12-01 ENCOUNTER — Encounter: Admit: 2022-12-01 | Discharge: 2022-12-01 | Payer: MEDICARE

## 2022-12-01 DIAGNOSIS — I4891 Unspecified atrial fibrillation: Secondary | ICD-10-CM

## 2022-12-01 DIAGNOSIS — Z7901 Long term (current) use of anticoagulants: Secondary | ICD-10-CM

## 2022-12-01 LAB — PROTIME INR (PT): INR: 2.6

## 2022-12-08 ENCOUNTER — Encounter: Admit: 2022-12-08 | Discharge: 2022-12-08 | Payer: MEDICARE

## 2022-12-08 DIAGNOSIS — Z7901 Long term (current) use of anticoagulants: Secondary | ICD-10-CM

## 2022-12-08 DIAGNOSIS — I4891 Unspecified atrial fibrillation: Secondary | ICD-10-CM

## 2022-12-08 LAB — PROTIME INR (PT): INR: 3

## 2022-12-15 ENCOUNTER — Encounter: Admit: 2022-12-15 | Discharge: 2022-12-15 | Payer: MEDICARE

## 2022-12-15 DIAGNOSIS — Z7901 Long term (current) use of anticoagulants: Secondary | ICD-10-CM

## 2022-12-15 DIAGNOSIS — I4891 Unspecified atrial fibrillation: Secondary | ICD-10-CM

## 2022-12-15 LAB — PROTIME INR (PT): INR: 2.7 % (ref 24–44)

## 2022-12-22 ENCOUNTER — Encounter: Admit: 2022-12-22 | Discharge: 2022-12-22 | Payer: MEDICARE

## 2022-12-22 DIAGNOSIS — I4891 Unspecified atrial fibrillation: Secondary | ICD-10-CM

## 2022-12-22 DIAGNOSIS — Z7901 Long term (current) use of anticoagulants: Secondary | ICD-10-CM

## 2022-12-29 ENCOUNTER — Encounter: Admit: 2022-12-29 | Discharge: 2022-12-29 | Payer: MEDICARE

## 2022-12-29 DIAGNOSIS — I4891 Unspecified atrial fibrillation: Secondary | ICD-10-CM

## 2022-12-29 DIAGNOSIS — Z7901 Long term (current) use of anticoagulants: Secondary | ICD-10-CM

## 2022-12-29 LAB — PROTIME INR (PT): INR: 2.7

## 2023-01-02 ENCOUNTER — Encounter: Admit: 2023-01-02 | Discharge: 2023-01-02 | Payer: MEDICARE

## 2023-01-02 MED ORDER — EZETIMIBE 10 MG PO TAB
ORAL_TABLET | 3 refills
Start: 2023-01-02 — End: ?

## 2023-01-05 ENCOUNTER — Encounter: Admit: 2023-01-05 | Discharge: 2023-01-05 | Payer: MEDICARE

## 2023-01-05 DIAGNOSIS — I4891 Unspecified atrial fibrillation: Secondary | ICD-10-CM

## 2023-01-05 DIAGNOSIS — Z7901 Long term (current) use of anticoagulants: Secondary | ICD-10-CM

## 2023-01-05 LAB — PROTIME INR (PT): INR: 2.1

## 2023-01-07 ENCOUNTER — Encounter: Admit: 2023-01-07 | Discharge: 2023-01-07 | Payer: MEDICARE

## 2023-01-07 MED ORDER — WARFARIN 5 MG PO TAB
ORAL_TABLET | ORAL | 3 refills | 90.00000 days | Status: AC
Start: 2023-01-07 — End: ?

## 2023-01-12 ENCOUNTER — Encounter: Admit: 2023-01-12 | Discharge: 2023-01-12 | Payer: MEDICARE

## 2023-01-12 DIAGNOSIS — I4891 Unspecified atrial fibrillation: Secondary | ICD-10-CM

## 2023-01-12 DIAGNOSIS — Z7901 Long term (current) use of anticoagulants: Secondary | ICD-10-CM

## 2023-01-12 LAB — PROTIME INR (PT): INR: 3

## 2023-01-19 ENCOUNTER — Encounter: Admit: 2023-01-19 | Discharge: 2023-01-19 | Payer: MEDICARE

## 2023-01-19 DIAGNOSIS — I4891 Unspecified atrial fibrillation: Secondary | ICD-10-CM

## 2023-01-19 DIAGNOSIS — Z7901 Long term (current) use of anticoagulants: Secondary | ICD-10-CM

## 2023-01-19 LAB — PROTIME INR (PT): INR: 1.7 — ABNORMAL LOW (ref 2–3)

## 2023-01-26 ENCOUNTER — Encounter: Admit: 2023-01-26 | Discharge: 2023-01-26 | Payer: MEDICARE

## 2023-01-26 DIAGNOSIS — I4891 Unspecified atrial fibrillation: Secondary | ICD-10-CM

## 2023-01-26 DIAGNOSIS — Z7901 Long term (current) use of anticoagulants: Secondary | ICD-10-CM

## 2023-01-26 LAB — PROTIME INR (PT): INR: 2.6

## 2023-02-02 ENCOUNTER — Encounter: Admit: 2023-02-02 | Discharge: 2023-02-02 | Payer: MEDICARE

## 2023-02-02 DIAGNOSIS — I4891 Unspecified atrial fibrillation: Secondary | ICD-10-CM

## 2023-02-02 DIAGNOSIS — Z7901 Long term (current) use of anticoagulants: Secondary | ICD-10-CM

## 2023-02-02 LAB — PROTIME INR (PT): INR: 2.8

## 2023-02-09 ENCOUNTER — Encounter: Admit: 2023-02-09 | Discharge: 2023-02-09 | Payer: MEDICARE

## 2023-02-09 DIAGNOSIS — I4891 Unspecified atrial fibrillation: Secondary | ICD-10-CM

## 2023-02-09 DIAGNOSIS — Z7901 Long term (current) use of anticoagulants: Secondary | ICD-10-CM

## 2023-02-09 LAB — PROTIME INR (PT): INR: 4.5 — ABNORMAL HIGH

## 2023-02-16 ENCOUNTER — Encounter: Admit: 2023-02-16 | Discharge: 2023-02-16 | Payer: MEDICARE

## 2023-02-16 DIAGNOSIS — I4891 Unspecified atrial fibrillation: Secondary | ICD-10-CM

## 2023-02-16 DIAGNOSIS — Z7901 Long term (current) use of anticoagulants: Secondary | ICD-10-CM

## 2023-02-16 LAB — PROTIME INR (PT): INR: 1.7 MMOL/L (ref 98–110)

## 2023-02-23 ENCOUNTER — Encounter: Admit: 2023-02-23 | Discharge: 2023-02-23 | Payer: MEDICARE

## 2023-02-23 DIAGNOSIS — Z7901 Long term (current) use of anticoagulants: Secondary | ICD-10-CM

## 2023-02-23 DIAGNOSIS — I4891 Unspecified atrial fibrillation: Secondary | ICD-10-CM

## 2023-02-23 LAB — PROTIME INR (PT): INR: 2.3

## 2023-02-25 ENCOUNTER — Encounter: Admit: 2023-02-25 | Discharge: 2023-02-25 | Payer: MEDICARE

## 2023-02-25 MED ORDER — METOPROLOL TARTRATE 25 MG PO TAB
ORAL_TABLET | ORAL | 3 refills | 90.00000 days | Status: AC
Start: 2023-02-25 — End: ?

## 2023-03-02 ENCOUNTER — Encounter: Admit: 2023-03-02 | Discharge: 2023-03-02 | Payer: MEDICARE

## 2023-03-02 DIAGNOSIS — I4891 Unspecified atrial fibrillation: Secondary | ICD-10-CM

## 2023-03-02 DIAGNOSIS — Z7901 Long term (current) use of anticoagulants: Secondary | ICD-10-CM

## 2023-03-02 LAB — PROTIME INR (PT): INR: 2.3

## 2023-03-09 ENCOUNTER — Encounter: Admit: 2023-03-09 | Discharge: 2023-03-09 | Payer: MEDICARE

## 2023-03-09 DIAGNOSIS — Z7901 Long term (current) use of anticoagulants: Secondary | ICD-10-CM

## 2023-03-09 DIAGNOSIS — I4891 Unspecified atrial fibrillation: Secondary | ICD-10-CM

## 2023-03-09 LAB — PROTIME INR (PT): INR: 2.7

## 2023-03-16 ENCOUNTER — Encounter: Admit: 2023-03-16 | Discharge: 2023-03-16 | Payer: MEDICARE

## 2023-03-16 DIAGNOSIS — Z7901 Long term (current) use of anticoagulants: Secondary | ICD-10-CM

## 2023-03-16 DIAGNOSIS — I4891 Unspecified atrial fibrillation: Secondary | ICD-10-CM

## 2023-03-16 LAB — PROTIME INR (PT): INR: 3

## 2023-03-23 ENCOUNTER — Encounter: Admit: 2023-03-23 | Discharge: 2023-03-23 | Payer: MEDICARE

## 2023-03-23 DIAGNOSIS — I4891 Unspecified atrial fibrillation: Secondary | ICD-10-CM

## 2023-03-23 DIAGNOSIS — Z7901 Long term (current) use of anticoagulants: Secondary | ICD-10-CM

## 2023-03-23 LAB — PROTIME INR (PT): INR: 4.8 U/L — ABNORMAL HIGH (ref 25–110)

## 2023-03-30 ENCOUNTER — Encounter: Admit: 2023-03-30 | Discharge: 2023-03-30 | Payer: MEDICARE

## 2023-03-30 DIAGNOSIS — Z7901 Long term (current) use of anticoagulants: Secondary | ICD-10-CM

## 2023-03-30 DIAGNOSIS — I4891 Unspecified atrial fibrillation: Secondary | ICD-10-CM

## 2023-03-30 LAB — PROTIME INR (PT): INR: 1.9

## 2023-04-06 ENCOUNTER — Encounter: Admit: 2023-04-06 | Discharge: 2023-04-06 | Payer: MEDICARE

## 2023-04-06 DIAGNOSIS — I4891 Unspecified atrial fibrillation: Secondary | ICD-10-CM

## 2023-04-06 DIAGNOSIS — Z7901 Long term (current) use of anticoagulants: Secondary | ICD-10-CM

## 2023-04-06 DIAGNOSIS — I350 Nonrheumatic aortic (valve) stenosis: Secondary | ICD-10-CM

## 2023-04-06 LAB — PROTIME INR (PT): INR: 3.3 — ABNORMAL HIGH

## 2023-04-06 NOTE — Progress Notes
04/06/2023 9:42 AM   Spoke with Gabriel Davidson regarding INR 3.3 (goal 2-3).  He states he has had family in town and not eating his regular diet with salads.   He only has 5 mg tablets.   Instructed to take warfarin 2.5 mg tonight then return to 5 mg daily with recheck on 04/13/23.          04/06/2023 9:25 AM   LVM for Gabriel Davidson regarding INR 3.3 (goal 2-3).  Requested call on Nurse Voicemail line at [phone] to discuss diet/medication changes or alcohol intake.  Plan to take warfarin 4 mg today then return to 5 mg daily with recheck on 04/13/23.

## 2023-04-13 ENCOUNTER — Encounter: Admit: 2023-04-13 | Discharge: 2023-04-13 | Payer: MEDICARE

## 2023-04-13 DIAGNOSIS — Z7901 Long term (current) use of anticoagulants: Secondary | ICD-10-CM

## 2023-04-13 DIAGNOSIS — I4891 Unspecified atrial fibrillation: Secondary | ICD-10-CM

## 2023-04-13 DIAGNOSIS — I350 Nonrheumatic aortic (valve) stenosis: Secondary | ICD-10-CM

## 2023-04-13 LAB — PROTIME INR (PT): INR: 3.1

## 2023-04-27 ENCOUNTER — Encounter: Admit: 2023-04-27 | Discharge: 2023-04-27 | Payer: MEDICARE

## 2023-04-27 DIAGNOSIS — I4891 Unspecified atrial fibrillation: Secondary | ICD-10-CM

## 2023-04-27 DIAGNOSIS — I350 Nonrheumatic aortic (valve) stenosis: Secondary | ICD-10-CM

## 2023-04-27 DIAGNOSIS — Z7901 Long term (current) use of anticoagulants: Secondary | ICD-10-CM

## 2023-05-04 ENCOUNTER — Encounter: Admit: 2023-05-04 | Discharge: 2023-05-04 | Payer: MEDICARE

## 2023-05-04 DIAGNOSIS — I350 Nonrheumatic aortic (valve) stenosis: Secondary | ICD-10-CM

## 2023-05-04 DIAGNOSIS — Z7901 Long term (current) use of anticoagulants: Secondary | ICD-10-CM

## 2023-05-04 DIAGNOSIS — I4891 Unspecified atrial fibrillation: Secondary | ICD-10-CM

## 2023-05-04 LAB — PROTIME INR (PT): INR: 4.1 — ABNORMAL HIGH

## 2023-05-11 ENCOUNTER — Encounter: Admit: 2023-05-11 | Discharge: 2023-05-11 | Payer: MEDICARE

## 2023-05-11 DIAGNOSIS — I4891 Unspecified atrial fibrillation: Secondary | ICD-10-CM

## 2023-05-11 NOTE — Progress Notes
05/11/2023 9:20 AM     Called and spoke to patient about INR. He notes he took a half tablet last Tuesday and last night. Pt will take 5 mg tonight and all other days and recheck INR next Tuesday. So pt to take 2.5 mg of Warfarin one day a week and 5 mg Warfarin all the other days. Pt verbalized understanding.

## 2023-05-18 ENCOUNTER — Encounter: Admit: 2023-05-18 | Discharge: 2023-05-18 | Payer: MEDICARE

## 2023-05-18 DIAGNOSIS — Z7901 Long term (current) use of anticoagulants: Secondary | ICD-10-CM

## 2023-05-18 DIAGNOSIS — I4891 Unspecified atrial fibrillation: Secondary | ICD-10-CM

## 2023-05-18 DIAGNOSIS — I350 Nonrheumatic aortic (valve) stenosis: Secondary | ICD-10-CM

## 2023-05-18 LAB — PROTIME INR (PT): INR: 2.3

## 2023-05-18 NOTE — Progress Notes
05/18/2023 8:20 AM     INR: 2.4    Per patient request, no phone call made to patient due to therapeutic INR results. Pt rechecks INR every week. No changes at this time.

## 2023-05-25 ENCOUNTER — Encounter: Admit: 2023-05-25 | Discharge: 2023-05-25 | Payer: MEDICARE

## 2023-05-25 DIAGNOSIS — Z7901 Long term (current) use of anticoagulants: Secondary | ICD-10-CM

## 2023-05-25 DIAGNOSIS — I350 Nonrheumatic aortic (valve) stenosis: Secondary | ICD-10-CM

## 2023-05-25 DIAGNOSIS — I4891 Unspecified atrial fibrillation: Secondary | ICD-10-CM

## 2023-05-25 LAB — PROTIME INR (PT): INR: 2.1

## 2023-06-01 ENCOUNTER — Encounter: Admit: 2023-06-01 | Discharge: 2023-06-01 | Payer: MEDICARE

## 2023-06-01 DIAGNOSIS — Z7901 Long term (current) use of anticoagulants: Secondary | ICD-10-CM

## 2023-06-01 DIAGNOSIS — I4891 Unspecified atrial fibrillation: Secondary | ICD-10-CM

## 2023-06-01 DIAGNOSIS — I350 Nonrheumatic aortic (valve) stenosis: Secondary | ICD-10-CM

## 2023-06-01 LAB — PROTIME INR (PT): INR: 1.8

## 2023-06-08 ENCOUNTER — Encounter: Admit: 2023-06-08 | Discharge: 2023-06-08 | Payer: MEDICARE

## 2023-06-08 DIAGNOSIS — I4891 Unspecified atrial fibrillation: Secondary | ICD-10-CM

## 2023-06-08 DIAGNOSIS — I350 Nonrheumatic aortic (valve) stenosis: Secondary | ICD-10-CM

## 2023-06-08 DIAGNOSIS — Z7901 Long term (current) use of anticoagulants: Secondary | ICD-10-CM

## 2023-06-08 LAB — PROTIME INR (PT): INR: 2

## 2023-06-15 ENCOUNTER — Encounter: Admit: 2023-06-15 | Discharge: 2023-06-15 | Payer: MEDICARE

## 2023-06-15 DIAGNOSIS — I4891 Unspecified atrial fibrillation: Secondary | ICD-10-CM

## 2023-06-15 DIAGNOSIS — I350 Nonrheumatic aortic (valve) stenosis: Secondary | ICD-10-CM

## 2023-06-15 DIAGNOSIS — Z7901 Long term (current) use of anticoagulants: Secondary | ICD-10-CM

## 2023-06-15 LAB — PROTIME INR (PT): INR: 1.5

## 2023-06-15 NOTE — Progress Notes
06/15/2023 10:32 AM   Assessment for low INR:     Have you missed any doses? No    Have you had any diet changes (increased foods high in Vitamin K)? Yes; been eating lots of broccoli the last 3 days and veggie trays d/t the holiday foods.     Any medication changes? No     Have you been hospitalized? No     Have you had any surgery, dental or other procedures? No     Instructed patient to report and seek emergent care for following symptoms:  New shortness of breath or chest pain  New pain, swelling, or redness in your arm or leg  Symptoms of TIA or stroke:  Sudden numbness or weakness of face, arm, or leg, especially on one side of the body  difficulty speaking or understanding speech  facial drooping  changes in vision   sudden severe headache  Sudden dizziness or difficulty walking     Instructions: Increase warfarin per protocol   12/31: Increase dose to 5mg    06/16/2023: Resume maintenance plan of 5mg  daily.     Pt verbalized her is planning to get back on track w/ his diet. Pt verbalized understanding of dosing schedule and provided successful read back. No further questions at this time.    Re check 06/22/2023

## 2023-06-22 ENCOUNTER — Encounter: Admit: 2023-06-22 | Discharge: 2023-06-22 | Payer: MEDICARE

## 2023-06-22 DIAGNOSIS — I4891 Unspecified atrial fibrillation: Secondary | ICD-10-CM

## 2023-06-22 DIAGNOSIS — I350 Nonrheumatic aortic (valve) stenosis: Secondary | ICD-10-CM

## 2023-06-22 DIAGNOSIS — Z7901 Long term (current) use of anticoagulants: Secondary | ICD-10-CM

## 2023-06-22 LAB — PROTIME INR (PT): INR: 3.2

## 2023-06-29 ENCOUNTER — Encounter: Admit: 2023-06-29 | Discharge: 2023-06-29 | Payer: MEDICARE

## 2023-06-29 DIAGNOSIS — I4891 Unspecified atrial fibrillation: Secondary | ICD-10-CM

## 2023-06-29 DIAGNOSIS — I350 Nonrheumatic aortic (valve) stenosis: Secondary | ICD-10-CM

## 2023-06-29 DIAGNOSIS — Z7901 Long term (current) use of anticoagulants: Secondary | ICD-10-CM

## 2023-06-29 LAB — PROTIME INR (PT): INR: 2.5

## 2023-06-30 ENCOUNTER — Encounter: Admit: 2023-06-30 | Discharge: 2023-06-30 | Payer: MEDICARE

## 2023-06-30 MED ORDER — ATORVASTATIN 80 MG PO TAB
ORAL_TABLET | 0 refills | Status: AC
Start: 2023-06-30 — End: ?

## 2023-07-01 ENCOUNTER — Encounter: Admit: 2023-07-01 | Discharge: 2023-07-01 | Payer: MEDICARE

## 2023-07-01 ENCOUNTER — Ambulatory Visit: Admit: 2023-07-01 | Discharge: 2023-07-01 | Payer: MEDICARE

## 2023-07-02 ENCOUNTER — Encounter: Admit: 2023-07-02 | Discharge: 2023-07-02 | Payer: MEDICARE

## 2023-07-02 NOTE — Progress Notes
The following are the results of aorta ultra sound and carotid duplex completed at  [facility]. Results routed to Dr. Sandria Manly for his review.

## 2023-07-06 ENCOUNTER — Encounter: Admit: 2023-07-06 | Discharge: 2023-07-06 | Payer: MEDICARE

## 2023-07-06 DIAGNOSIS — Z7901 Long term (current) use of anticoagulants: Secondary | ICD-10-CM

## 2023-07-06 DIAGNOSIS — I350 Nonrheumatic aortic (valve) stenosis: Secondary | ICD-10-CM

## 2023-07-06 DIAGNOSIS — I4891 Unspecified atrial fibrillation: Secondary | ICD-10-CM

## 2023-07-07 ENCOUNTER — Encounter: Admit: 2023-07-07 | Discharge: 2023-07-07 | Payer: MEDICARE

## 2023-07-08 ENCOUNTER — Encounter: Admit: 2023-07-08 | Discharge: 2023-07-08 | Payer: MEDICARE

## 2023-07-13 ENCOUNTER — Encounter: Admit: 2023-07-13 | Discharge: 2023-07-13 | Payer: MEDICARE

## 2023-07-13 DIAGNOSIS — I4891 Unspecified atrial fibrillation: Secondary | ICD-10-CM

## 2023-07-13 DIAGNOSIS — I350 Nonrheumatic aortic (valve) stenosis: Secondary | ICD-10-CM

## 2023-07-13 DIAGNOSIS — Z7901 Long term (current) use of anticoagulants: Secondary | ICD-10-CM

## 2023-07-13 LAB — PROTIME INR (PT): INR: 3.4

## 2023-07-20 ENCOUNTER — Encounter: Admit: 2023-07-20 | Discharge: 2023-07-20 | Payer: MEDICARE

## 2023-07-20 DIAGNOSIS — I350 Nonrheumatic aortic (valve) stenosis: Secondary | ICD-10-CM

## 2023-07-20 DIAGNOSIS — Z7901 Long term (current) use of anticoagulants: Secondary | ICD-10-CM

## 2023-07-20 DIAGNOSIS — I4891 Unspecified atrial fibrillation: Secondary | ICD-10-CM

## 2023-07-20 LAB — PROTIME INR (PT): INR: 2.4

## 2023-07-21 ENCOUNTER — Encounter: Admit: 2023-07-21 | Discharge: 2023-07-21 | Payer: MEDICARE

## 2023-07-21 NOTE — Telephone Encounter
-----   Message from Loura Halt, MD sent at 07/21/2023  7:40 AM CST -----  Echocardiogram with normal heart pump function.  He does have a couple of leaky heart valves and it looks like he may be hypervolemic based on the echocardiogram results.  How is he feeling?  Is he having any shortness of breath or lower extremity swelling?  Any recent weight gain?

## 2023-07-21 NOTE — Telephone Encounter
Called and discussed with patient.  He states that he is doing okay.  He has been working on his diet and has actually lost a few pounds and states his weight at home is around 186.  He denies any shortness of breath or swelling.    Will route to Dr. Sandria Manly for review and recommendations.

## 2023-07-27 ENCOUNTER — Encounter: Admit: 2023-07-27 | Discharge: 2023-07-27 | Payer: MEDICARE

## 2023-07-27 DIAGNOSIS — I10 Essential (primary) hypertension: Secondary | ICD-10-CM

## 2023-07-27 DIAGNOSIS — I350 Nonrheumatic aortic (valve) stenosis: Secondary | ICD-10-CM

## 2023-07-27 DIAGNOSIS — I4891 Unspecified atrial fibrillation: Secondary | ICD-10-CM

## 2023-07-27 DIAGNOSIS — I7771 Dissection of carotid artery: Secondary | ICD-10-CM

## 2023-07-27 DIAGNOSIS — Z7901 Long term (current) use of anticoagulants: Secondary | ICD-10-CM

## 2023-07-27 LAB — PROTIME INR (PT): INR: 2.1

## 2023-08-03 ENCOUNTER — Encounter: Admit: 2023-08-03 | Discharge: 2023-08-03 | Payer: MEDICARE

## 2023-08-03 DIAGNOSIS — I350 Nonrheumatic aortic (valve) stenosis: Secondary | ICD-10-CM

## 2023-08-03 DIAGNOSIS — I4891 Unspecified atrial fibrillation: Secondary | ICD-10-CM

## 2023-08-03 DIAGNOSIS — Z7901 Long term (current) use of anticoagulants: Secondary | ICD-10-CM

## 2023-08-03 LAB — PROTIME INR (PT): INR: 2.5

## 2023-08-04 ENCOUNTER — Encounter: Admit: 2023-08-04 | Discharge: 2023-08-04 | Payer: MEDICARE

## 2023-08-04 DIAGNOSIS — I4891 Unspecified atrial fibrillation: Secondary | ICD-10-CM

## 2023-08-04 DIAGNOSIS — I10 Essential (primary) hypertension: Secondary | ICD-10-CM

## 2023-08-04 DIAGNOSIS — Z7901 Long term (current) use of anticoagulants: Secondary | ICD-10-CM

## 2023-08-04 DIAGNOSIS — I7771 Dissection of carotid artery: Secondary | ICD-10-CM

## 2023-08-04 DIAGNOSIS — I350 Nonrheumatic aortic (valve) stenosis: Secondary | ICD-10-CM

## 2023-08-04 LAB — COMPREHENSIVE METABOLIC PANEL
ALBUMIN: 3.9
ALK PHOSPHATASE: 86
ALT: 36
ANION GAP: 6 — ABNORMAL LOW (ref 8–16)
AST: 31
BLD UREA NITROGEN: 16 — ABNORMAL HIGH (ref 25.6–32.2)
CALCIUM: 9
CHLORIDE: 109 — ABNORMAL HIGH (ref 98–107)
CO2: 25 — ABNORMAL HIGH (ref 79.0–92.2)
CREATININE: 0.9
GLUCOSE,PANEL: 86
POTASSIUM: 4
TOTAL BILIRUBIN: 1.7 — ABNORMAL HIGH (ref 0.2–1.2)
TOTAL PROTEIN: 6.7

## 2023-08-04 LAB — CBC
RBC COUNT: 4.6 — ABNORMAL LOW (ref 4.63–6.08)
WBC COUNT: 5.4

## 2023-08-09 ENCOUNTER — Encounter: Admit: 2023-08-09 | Discharge: 2023-08-09 | Payer: MEDICARE

## 2023-08-10 ENCOUNTER — Encounter: Admit: 2023-08-10 | Discharge: 2023-08-10 | Payer: MEDICARE

## 2023-08-10 ENCOUNTER — Ambulatory Visit: Admit: 2023-08-10 | Discharge: 2023-08-11 | Payer: MEDICARE

## 2023-08-10 DIAGNOSIS — I4891 Unspecified atrial fibrillation: Secondary | ICD-10-CM

## 2023-08-10 DIAGNOSIS — I350 Nonrheumatic aortic (valve) stenosis: Secondary | ICD-10-CM

## 2023-08-10 DIAGNOSIS — Z7901 Long term (current) use of anticoagulants: Secondary | ICD-10-CM

## 2023-08-10 LAB — PROTIME INR (PT): INR: 2.1

## 2023-08-17 ENCOUNTER — Encounter: Admit: 2023-08-17 | Discharge: 2023-08-17 | Payer: MEDICARE

## 2023-08-17 DIAGNOSIS — I350 Nonrheumatic aortic (valve) stenosis: Secondary | ICD-10-CM

## 2023-08-17 DIAGNOSIS — I4891 Unspecified atrial fibrillation: Secondary | ICD-10-CM

## 2023-08-17 DIAGNOSIS — Z7901 Long term (current) use of anticoagulants: Secondary | ICD-10-CM

## 2023-08-24 ENCOUNTER — Encounter: Admit: 2023-08-24 | Discharge: 2023-08-24 | Payer: MEDICARE

## 2023-08-24 DIAGNOSIS — I4891 Unspecified atrial fibrillation: Secondary | ICD-10-CM

## 2023-08-24 DIAGNOSIS — Z7901 Long term (current) use of anticoagulants: Secondary | ICD-10-CM

## 2023-08-24 DIAGNOSIS — I350 Nonrheumatic aortic (valve) stenosis: Secondary | ICD-10-CM

## 2023-08-24 NOTE — Progress Notes
 Pt has been eating more greens lately, had some broccoli the past few days. He plans on making corned beef and cabbage soon. He says he can plan to cut back on greens a little bit. Advised he cut back slightly but not completely, will increase warfarin to 2.5 mg on Monday and 5 mg on the other days, he verbalizes understanding.

## 2023-08-31 ENCOUNTER — Encounter: Admit: 2023-08-31 | Discharge: 2023-08-31 | Payer: MEDICARE

## 2023-08-31 DIAGNOSIS — Z7901 Long term (current) use of anticoagulants: Secondary | ICD-10-CM

## 2023-08-31 DIAGNOSIS — I350 Nonrheumatic aortic (valve) stenosis: Secondary | ICD-10-CM

## 2023-08-31 DIAGNOSIS — I4891 Unspecified atrial fibrillation: Secondary | ICD-10-CM

## 2023-08-31 LAB — PROTIME INR (PT): INR: 3.2 — ABNORMAL HIGH

## 2023-09-07 ENCOUNTER — Encounter: Admit: 2023-09-07 | Discharge: 2023-09-07 | Payer: MEDICARE

## 2023-09-07 DIAGNOSIS — I350 Nonrheumatic aortic (valve) stenosis: Secondary | ICD-10-CM

## 2023-09-07 DIAGNOSIS — Z7901 Long term (current) use of anticoagulants: Secondary | ICD-10-CM

## 2023-09-07 DIAGNOSIS — I4891 Unspecified atrial fibrillation: Secondary | ICD-10-CM

## 2023-09-07 LAB — PROTIME INR (PT): INR: 2

## 2023-09-14 ENCOUNTER — Encounter: Admit: 2023-09-14 | Discharge: 2023-09-14

## 2023-09-14 DIAGNOSIS — I350 Nonrheumatic aortic (valve) stenosis: Secondary | ICD-10-CM

## 2023-09-14 DIAGNOSIS — I4891 Unspecified atrial fibrillation: Secondary | ICD-10-CM

## 2023-09-14 DIAGNOSIS — Z7901 Long term (current) use of anticoagulants: Secondary | ICD-10-CM

## 2023-09-14 LAB — PROTIME INR (PT): INR: 2.5

## 2023-09-21 ENCOUNTER — Encounter: Admit: 2023-09-21 | Discharge: 2023-09-21

## 2023-09-21 DIAGNOSIS — I4891 Unspecified atrial fibrillation: Secondary | ICD-10-CM

## 2023-09-21 DIAGNOSIS — Z7901 Long term (current) use of anticoagulants: Secondary | ICD-10-CM

## 2023-09-21 DIAGNOSIS — I350 Nonrheumatic aortic (valve) stenosis: Secondary | ICD-10-CM

## 2023-09-21 LAB — PROTIME INR (PT): INR: 2.7

## 2023-09-28 ENCOUNTER — Encounter: Admit: 2023-09-28 | Discharge: 2023-09-28 | Payer: MEDICARE

## 2023-09-28 DIAGNOSIS — I4891 Unspecified atrial fibrillation: Secondary | ICD-10-CM

## 2023-09-28 DIAGNOSIS — I350 Nonrheumatic aortic (valve) stenosis: Secondary | ICD-10-CM

## 2023-09-28 DIAGNOSIS — Z7901 Long term (current) use of anticoagulants: Secondary | ICD-10-CM

## 2023-09-28 LAB — PROTIME INR (PT): INR: 2.1

## 2023-10-05 ENCOUNTER — Encounter: Admit: 2023-10-05 | Discharge: 2023-10-05 | Payer: MEDICARE

## 2023-10-05 DIAGNOSIS — Z7901 Long term (current) use of anticoagulants: Secondary | ICD-10-CM

## 2023-10-05 DIAGNOSIS — I350 Nonrheumatic aortic (valve) stenosis: Secondary | ICD-10-CM

## 2023-10-05 DIAGNOSIS — I4891 Unspecified atrial fibrillation: Secondary | ICD-10-CM

## 2023-10-05 LAB — PROTIME INR (PT): INR: 3.2

## 2023-10-11 ENCOUNTER — Encounter: Admit: 2023-10-11 | Discharge: 2023-10-11 | Payer: MEDICARE

## 2023-10-11 MED ORDER — ATORVASTATIN 80 MG PO TAB
80 mg | ORAL_TABLET | Freq: Every day | ORAL | 3 refills | 90.00000 days | Status: AC
Start: 2023-10-11 — End: ?

## 2023-10-12 ENCOUNTER — Encounter: Admit: 2023-10-12 | Discharge: 2023-10-12 | Payer: MEDICARE

## 2023-10-12 DIAGNOSIS — Z7901 Long term (current) use of anticoagulants: Secondary | ICD-10-CM

## 2023-10-12 DIAGNOSIS — I4891 Unspecified atrial fibrillation: Secondary | ICD-10-CM

## 2023-10-12 DIAGNOSIS — I350 Nonrheumatic aortic (valve) stenosis: Secondary | ICD-10-CM

## 2023-10-12 LAB — PROTIME INR (PT): INR: 2.4

## 2023-10-19 ENCOUNTER — Encounter: Admit: 2023-10-19 | Discharge: 2023-10-19 | Payer: MEDICARE

## 2023-10-19 DIAGNOSIS — I350 Nonrheumatic aortic (valve) stenosis: Secondary | ICD-10-CM

## 2023-10-19 DIAGNOSIS — I4891 Unspecified atrial fibrillation: Secondary | ICD-10-CM

## 2023-10-19 DIAGNOSIS — Z7901 Long term (current) use of anticoagulants: Secondary | ICD-10-CM

## 2023-10-19 LAB — PROTIME INR (PT): INR: 2.6

## 2023-10-26 ENCOUNTER — Encounter: Admit: 2023-10-26 | Discharge: 2023-10-26 | Payer: MEDICARE

## 2023-10-26 DIAGNOSIS — I4891 Unspecified atrial fibrillation: Secondary | ICD-10-CM

## 2023-11-02 ENCOUNTER — Encounter: Admit: 2023-11-02 | Discharge: 2023-11-02 | Payer: MEDICARE

## 2023-11-02 DIAGNOSIS — I350 Nonrheumatic aortic (valve) stenosis: Secondary | ICD-10-CM

## 2023-11-02 DIAGNOSIS — I4891 Unspecified atrial fibrillation: Secondary | ICD-10-CM

## 2023-11-02 DIAGNOSIS — Z7901 Long term (current) use of anticoagulants: Secondary | ICD-10-CM

## 2023-11-02 LAB — PROTIME INR (PT): INR: 2.9

## 2023-11-09 ENCOUNTER — Encounter: Admit: 2023-11-09 | Discharge: 2023-11-09 | Payer: MEDICARE

## 2023-11-09 DIAGNOSIS — I350 Nonrheumatic aortic (valve) stenosis: Secondary | ICD-10-CM

## 2023-11-09 DIAGNOSIS — Z7901 Long term (current) use of anticoagulants: Secondary | ICD-10-CM

## 2023-11-09 DIAGNOSIS — I4891 Unspecified atrial fibrillation: Secondary | ICD-10-CM

## 2023-11-09 LAB — PROTIME INR (PT): INR: 2.9 10*3/uL (ref 3–12)

## 2023-11-16 ENCOUNTER — Encounter: Admit: 2023-11-16 | Discharge: 2023-11-16 | Payer: MEDICARE

## 2023-11-16 DIAGNOSIS — I4891 Unspecified atrial fibrillation: Secondary | ICD-10-CM

## 2023-11-16 DIAGNOSIS — I350 Nonrheumatic aortic (valve) stenosis: Secondary | ICD-10-CM

## 2023-11-16 DIAGNOSIS — Z7901 Long term (current) use of anticoagulants: Secondary | ICD-10-CM

## 2023-11-16 LAB — PROTIME INR (PT): INR: 2.4

## 2023-11-23 ENCOUNTER — Encounter: Admit: 2023-11-23 | Discharge: 2023-11-23 | Payer: MEDICARE

## 2023-11-30 ENCOUNTER — Encounter: Admit: 2023-11-30 | Discharge: 2023-11-30 | Payer: MEDICARE

## 2023-11-30 DIAGNOSIS — I350 Nonrheumatic aortic (valve) stenosis: Secondary | ICD-10-CM

## 2023-11-30 DIAGNOSIS — I4891 Unspecified atrial fibrillation: Secondary | ICD-10-CM

## 2023-11-30 DIAGNOSIS — Z7901 Long term (current) use of anticoagulants: Secondary | ICD-10-CM

## 2023-12-07 ENCOUNTER — Encounter: Admit: 2023-12-07 | Discharge: 2023-12-07 | Payer: MEDICARE

## 2023-12-07 DIAGNOSIS — I4891 Unspecified atrial fibrillation: Secondary | ICD-10-CM

## 2023-12-14 ENCOUNTER — Encounter: Admit: 2023-12-14 | Discharge: 2023-12-14 | Payer: MEDICARE

## 2023-12-14 DIAGNOSIS — Z7901 Long term (current) use of anticoagulants: Principal | ICD-10-CM

## 2023-12-14 DIAGNOSIS — I4891 Unspecified atrial fibrillation: Secondary | ICD-10-CM

## 2023-12-14 DIAGNOSIS — I350 Nonrheumatic aortic (valve) stenosis: Secondary | ICD-10-CM

## 2023-12-21 ENCOUNTER — Encounter: Admit: 2023-12-21 | Discharge: 2023-12-21 | Payer: MEDICARE

## 2023-12-21 DIAGNOSIS — I4891 Unspecified atrial fibrillation: Principal | ICD-10-CM

## 2023-12-28 ENCOUNTER — Encounter: Admit: 2023-12-28 | Discharge: 2023-12-28 | Payer: MEDICARE

## 2023-12-28 DIAGNOSIS — I350 Nonrheumatic aortic (valve) stenosis: Secondary | ICD-10-CM

## 2023-12-28 DIAGNOSIS — I4891 Unspecified atrial fibrillation: Principal | ICD-10-CM

## 2023-12-28 DIAGNOSIS — Z7901 Long term (current) use of anticoagulants: Principal | ICD-10-CM

## 2023-12-28 LAB — PROTIME INR (PT): INR: 3.3

## 2024-01-04 ENCOUNTER — Encounter: Admit: 2024-01-04 | Discharge: 2024-01-04 | Payer: MEDICARE

## 2024-01-04 DIAGNOSIS — I350 Nonrheumatic aortic (valve) stenosis: Secondary | ICD-10-CM

## 2024-01-04 DIAGNOSIS — Z7901 Long term (current) use of anticoagulants: Principal | ICD-10-CM

## 2024-01-04 DIAGNOSIS — I4891 Unspecified atrial fibrillation: Secondary | ICD-10-CM

## 2024-01-04 LAB — PROTIME INR (PT): INR: 2.1

## 2024-01-04 MED ORDER — EZETIMIBE 10 MG PO TAB
10 mg | ORAL_TABLET | Freq: Every day | ORAL | 3 refills | 90.00000 days | Status: AC
Start: 2024-01-04 — End: ?

## 2024-01-11 ENCOUNTER — Encounter: Admit: 2024-01-11 | Discharge: 2024-01-11 | Payer: MEDICARE

## 2024-01-11 DIAGNOSIS — I4891 Unspecified atrial fibrillation: Principal | ICD-10-CM

## 2024-01-18 ENCOUNTER — Encounter: Admit: 2024-01-18 | Discharge: 2024-01-18 | Payer: MEDICARE

## 2024-01-18 DIAGNOSIS — I4891 Unspecified atrial fibrillation: Secondary | ICD-10-CM

## 2024-01-18 DIAGNOSIS — I350 Nonrheumatic aortic (valve) stenosis: Secondary | ICD-10-CM

## 2024-01-18 DIAGNOSIS — Z7901 Long term (current) use of anticoagulants: Principal | ICD-10-CM

## 2024-01-18 LAB — PROTIME INR (PT): INR: 2.2

## 2024-01-25 ENCOUNTER — Encounter: Admit: 2024-01-25 | Discharge: 2024-01-25 | Payer: MEDICARE

## 2024-01-25 DIAGNOSIS — Z7901 Long term (current) use of anticoagulants: Secondary | ICD-10-CM

## 2024-01-25 DIAGNOSIS — I350 Nonrheumatic aortic (valve) stenosis: Secondary | ICD-10-CM

## 2024-01-25 DIAGNOSIS — I4891 Unspecified atrial fibrillation: Principal | ICD-10-CM

## 2024-01-25 LAB — PROTIME INR (PT): INR: 2.4

## 2024-01-25 NOTE — Progress Notes
 01/25/2024 8:28 AM   INR: 2.4 (goal 2.0-3.0).     Pt prefers no phone call when within range. Plans to continue maintenance plan of 2.5mg  every Monday, and 5mg  all other days. Re check: 02/01/2024

## 2024-02-01 ENCOUNTER — Encounter: Admit: 2024-02-01 | Discharge: 2024-02-01 | Payer: MEDICARE

## 2024-02-01 DIAGNOSIS — I350 Nonrheumatic aortic (valve) stenosis: Secondary | ICD-10-CM

## 2024-02-01 DIAGNOSIS — Z7901 Long term (current) use of anticoagulants: Principal | ICD-10-CM

## 2024-02-01 DIAGNOSIS — I4891 Unspecified atrial fibrillation: Secondary | ICD-10-CM

## 2024-02-01 LAB — PROTIME INR (PT): INR: 2.4

## 2024-02-08 ENCOUNTER — Encounter: Admit: 2024-02-08 | Discharge: 2024-02-08 | Payer: MEDICARE

## 2024-02-08 DIAGNOSIS — I4891 Unspecified atrial fibrillation: Secondary | ICD-10-CM

## 2024-02-08 DIAGNOSIS — I350 Nonrheumatic aortic (valve) stenosis: Secondary | ICD-10-CM

## 2024-02-08 DIAGNOSIS — Z7901 Long term (current) use of anticoagulants: Principal | ICD-10-CM

## 2024-02-08 LAB — PROTIME INR (PT): INR: 2.9

## 2024-02-15 ENCOUNTER — Encounter: Admit: 2024-02-15 | Discharge: 2024-02-15 | Payer: MEDICARE

## 2024-02-15 DIAGNOSIS — I4891 Unspecified atrial fibrillation: Secondary | ICD-10-CM

## 2024-02-15 DIAGNOSIS — I350 Nonrheumatic aortic (valve) stenosis: Secondary | ICD-10-CM

## 2024-02-15 DIAGNOSIS — Z7901 Long term (current) use of anticoagulants: Principal | ICD-10-CM

## 2024-02-15 LAB — PROTIME INR (PT): INR: 2.6

## 2024-02-22 ENCOUNTER — Encounter: Admit: 2024-02-22 | Discharge: 2024-02-22 | Payer: MEDICARE

## 2024-02-22 DIAGNOSIS — I4891 Unspecified atrial fibrillation: Principal | ICD-10-CM

## 2024-02-22 DIAGNOSIS — Z7901 Long term (current) use of anticoagulants: Principal | ICD-10-CM

## 2024-02-22 DIAGNOSIS — I350 Nonrheumatic aortic (valve) stenosis: Secondary | ICD-10-CM

## 2024-02-29 ENCOUNTER — Encounter: Admit: 2024-02-29 | Discharge: 2024-02-29 | Payer: MEDICARE

## 2024-02-29 DIAGNOSIS — I350 Nonrheumatic aortic (valve) stenosis: Secondary | ICD-10-CM

## 2024-02-29 DIAGNOSIS — I4891 Unspecified atrial fibrillation: Principal | ICD-10-CM

## 2024-02-29 DIAGNOSIS — Z7901 Long term (current) use of anticoagulants: Principal | ICD-10-CM

## 2024-02-29 LAB — PROTIME INR (PT): INR: 2.7

## 2024-02-29 MED ORDER — METOPROLOL TARTRATE 25 MG PO TAB
75 mg | ORAL_TABLET | Freq: Two times a day (BID) | ORAL | 3 refills | 90.00000 days | Status: AC
Start: 2024-02-29 — End: ?

## 2024-03-07 ENCOUNTER — Encounter: Admit: 2024-03-07 | Discharge: 2024-03-07 | Payer: MEDICARE

## 2024-03-07 DIAGNOSIS — I350 Nonrheumatic aortic (valve) stenosis: Secondary | ICD-10-CM

## 2024-03-07 DIAGNOSIS — Z7901 Long term (current) use of anticoagulants: Principal | ICD-10-CM

## 2024-03-07 DIAGNOSIS — I4891 Unspecified atrial fibrillation: Secondary | ICD-10-CM

## 2024-03-07 LAB — PROTIME INR (PT): INR: 2.7 (ref 2–3)

## 2024-03-14 ENCOUNTER — Encounter: Admit: 2024-03-14 | Discharge: 2024-03-14 | Payer: MEDICARE

## 2024-03-14 DIAGNOSIS — I4891 Unspecified atrial fibrillation: Secondary | ICD-10-CM

## 2024-03-14 DIAGNOSIS — Z7901 Long term (current) use of anticoagulants: Principal | ICD-10-CM

## 2024-03-14 DIAGNOSIS — I350 Nonrheumatic aortic (valve) stenosis: Secondary | ICD-10-CM

## 2024-03-14 LAB — PROTIME INR (PT): INR: 2.7

## 2024-03-21 ENCOUNTER — Encounter: Admit: 2024-03-21 | Discharge: 2024-03-21 | Payer: MEDICARE

## 2024-03-21 DIAGNOSIS — Z7901 Long term (current) use of anticoagulants: Principal | ICD-10-CM

## 2024-03-21 DIAGNOSIS — I4891 Unspecified atrial fibrillation: Secondary | ICD-10-CM

## 2024-03-21 DIAGNOSIS — I350 Nonrheumatic aortic (valve) stenosis: Secondary | ICD-10-CM

## 2024-03-21 LAB — PROTIME INR (PT): INR: 2.5

## 2024-03-28 ENCOUNTER — Encounter: Admit: 2024-03-28 | Discharge: 2024-03-28 | Payer: MEDICARE

## 2024-03-28 DIAGNOSIS — I350 Nonrheumatic aortic (valve) stenosis: Secondary | ICD-10-CM

## 2024-03-28 DIAGNOSIS — I4891 Unspecified atrial fibrillation: Principal | ICD-10-CM

## 2024-03-28 DIAGNOSIS — Z7901 Long term (current) use of anticoagulants: Principal | ICD-10-CM

## 2024-03-28 LAB — PROTIME INR (PT): INR: 2.7

## 2024-04-04 ENCOUNTER — Encounter: Admit: 2024-04-04 | Discharge: 2024-04-04 | Payer: MEDICARE

## 2024-04-04 DIAGNOSIS — I4891 Unspecified atrial fibrillation: Secondary | ICD-10-CM

## 2024-04-04 DIAGNOSIS — I350 Nonrheumatic aortic (valve) stenosis: Secondary | ICD-10-CM

## 2024-04-04 DIAGNOSIS — Z7901 Long term (current) use of anticoagulants: Principal | ICD-10-CM

## 2024-04-04 LAB — PROTIME INR (PT): INR: 2.5

## 2024-04-11 ENCOUNTER — Encounter: Admit: 2024-04-11 | Discharge: 2024-04-11 | Payer: MEDICARE

## 2024-04-11 DIAGNOSIS — I4891 Unspecified atrial fibrillation: Principal | ICD-10-CM

## 2024-04-11 DIAGNOSIS — Z7901 Long term (current) use of anticoagulants: Principal | ICD-10-CM

## 2024-04-11 LAB — PROTIME INR (PT): INR: 2.8

## 2024-04-18 ENCOUNTER — Encounter: Admit: 2024-04-18 | Discharge: 2024-04-18 | Payer: MEDICARE

## 2024-04-18 DIAGNOSIS — Z7901 Long term (current) use of anticoagulants: Principal | ICD-10-CM

## 2024-04-18 DIAGNOSIS — I4891 Unspecified atrial fibrillation: Principal | ICD-10-CM

## 2024-04-18 LAB — PROTIME INR (PT): INR: 2.4

## 2024-04-25 ENCOUNTER — Encounter: Admit: 2024-04-25 | Discharge: 2024-04-25 | Payer: MEDICARE

## 2024-04-25 DIAGNOSIS — Z7901 Long term (current) use of anticoagulants: Principal | ICD-10-CM

## 2024-04-25 LAB — PROTIME INR (PT): INR: 2

## 2024-04-27 ENCOUNTER — Encounter: Admit: 2024-04-27 | Discharge: 2024-04-27 | Payer: MEDICARE

## 2024-04-27 MED ORDER — WARFARIN 5 MG PO TAB
ORAL_TABLET | ORAL | 3 refills | 90.00000 days | Status: AC
Start: 2024-04-27 — End: ?

## 2024-05-02 ENCOUNTER — Encounter: Admit: 2024-05-02 | Discharge: 2024-05-02 | Payer: MEDICARE

## 2024-05-02 DIAGNOSIS — Z7901 Long term (current) use of anticoagulants: Secondary | ICD-10-CM

## 2024-05-02 DIAGNOSIS — I4891 Unspecified atrial fibrillation: Principal | ICD-10-CM

## 2024-05-02 LAB — PROTIME INR (PT): INR: 2.6

## 2024-05-09 ENCOUNTER — Encounter: Admit: 2024-05-09 | Discharge: 2024-05-09 | Payer: MEDICARE

## 2024-05-09 DIAGNOSIS — I4891 Unspecified atrial fibrillation: Principal | ICD-10-CM

## 2024-05-09 DIAGNOSIS — Z7901 Long term (current) use of anticoagulants: Principal | ICD-10-CM

## 2024-05-16 ENCOUNTER — Encounter: Admit: 2024-05-16 | Discharge: 2024-05-16 | Payer: MEDICARE

## 2024-05-16 DIAGNOSIS — Z7901 Long term (current) use of anticoagulants: Principal | ICD-10-CM

## 2024-05-23 ENCOUNTER — Encounter: Admit: 2024-05-23 | Discharge: 2024-05-23 | Payer: MEDICARE

## 2024-05-23 DIAGNOSIS — Z7901 Long term (current) use of anticoagulants: Principal | ICD-10-CM

## 2024-05-23 LAB — PROTIME INR (PT): INR: 1.9 mmol/L — ABNORMAL LOW (ref 3.5–5.1)

## 2024-05-23 NOTE — Progress Notes [1]
 Pt denies any changes in  medications, reports no bleeding or other issues. Pt stated he has been eating more greens and he will cut back on that. Dosage of coumadin  verified, pt verbalized understanding of instructions and when next INR is due to be drawn.

## 2024-05-30 ENCOUNTER — Encounter: Admit: 2024-05-30 | Discharge: 2024-05-30 | Payer: MEDICARE

## 2024-05-30 DIAGNOSIS — Z7901 Long term (current) use of anticoagulants: Principal | ICD-10-CM

## 2024-05-30 LAB — PROTIME INR (PT): INR: 2.2

## 2024-06-06 ENCOUNTER — Encounter: Admit: 2024-06-06 | Discharge: 2024-06-06 | Payer: MEDICARE

## 2024-06-06 DIAGNOSIS — Z7901 Long term (current) use of anticoagulants: Principal | ICD-10-CM

## 2024-06-06 LAB — PROTIME INR (PT): INR: 1.9 mg/dL — ABNORMAL HIGH (ref 7–25)

## 2024-06-13 ENCOUNTER — Encounter: Admit: 2024-06-13 | Discharge: 2024-06-13 | Payer: MEDICARE

## 2024-06-13 DIAGNOSIS — Z7901 Long term (current) use of anticoagulants: Principal | ICD-10-CM

## 2024-06-13 DIAGNOSIS — I4891 Unspecified atrial fibrillation: Principal | ICD-10-CM

## 2024-06-20 ENCOUNTER — Encounter: Admit: 2024-06-20 | Discharge: 2024-06-20 | Payer: MEDICARE

## 2024-06-20 DIAGNOSIS — I4891 Unspecified atrial fibrillation: Principal | ICD-10-CM

## 2024-06-20 DIAGNOSIS — Z7901 Long term (current) use of anticoagulants: Principal | ICD-10-CM

## 2024-06-20 LAB — PROTIME INR (PT): INR: 1.9 — ABNORMAL LOW (ref 2–3)

## 2024-06-27 ENCOUNTER — Encounter: Admit: 2024-06-27 | Discharge: 2024-06-27 | Payer: MEDICARE

## 2024-06-27 DIAGNOSIS — I4891 Unspecified atrial fibrillation: Principal | ICD-10-CM

## 2024-06-27 DIAGNOSIS — Z7901 Long term (current) use of anticoagulants: Principal | ICD-10-CM

## 2024-06-27 LAB — PROTIME INR (PT): INR: 2.4

## 2024-07-04 ENCOUNTER — Encounter: Admit: 2024-07-04 | Discharge: 2024-07-04 | Payer: MEDICARE

## 2024-07-04 DIAGNOSIS — I4891 Unspecified atrial fibrillation: Principal | ICD-10-CM

## 2024-07-04 DIAGNOSIS — Z7901 Long term (current) use of anticoagulants: Principal | ICD-10-CM

## 2024-07-04 LAB — PROTIME INR (PT): INR: 2.9

## 2024-07-11 ENCOUNTER — Encounter: Admit: 2024-07-11 | Discharge: 2024-07-11 | Payer: MEDICARE

## 2024-07-11 DIAGNOSIS — Z7901 Long term (current) use of anticoagulants: Principal | ICD-10-CM

## 2024-07-11 LAB — PROTIME INR (PT): INR: 3.3 — ABNORMAL HIGH

## 2024-07-18 ENCOUNTER — Encounter: Admit: 2024-07-18 | Discharge: 2024-07-18 | Payer: MEDICARE

## 2024-07-18 DIAGNOSIS — I4891 Unspecified atrial fibrillation: Principal | ICD-10-CM

## 2024-07-18 DIAGNOSIS — Z7901 Long term (current) use of anticoagulants: Principal | ICD-10-CM

## 2024-07-18 LAB — PROTIME INR (PT): INR: 2.6
